# Patient Record
Sex: Female | Born: 1960 | Race: White | Hispanic: No | Smoking: Never smoker
Health system: Southern US, Community
[De-identification: ages and names within clinical notes are randomized; demographics above are authoritative.]

## PROBLEM LIST (undated history)

## (undated) HISTORY — PX: BREAST ENHANCEMENT SURGERY: SHX7

## (undated) HISTORY — PX: MASTECTOMY: SHX3

---

## 2006-12-30 DIAGNOSIS — C50219 Malignant neoplasm of upper-inner quadrant of unspecified female breast: Secondary | ICD-10-CM | POA: Insufficient documentation

## 2007-06-04 HISTORY — PX: ABDOMINAL HYSTERECTOMY: SHX81

## 2013-04-15 HISTORY — PX: COLONOSCOPY: SHX174

## 2014-12-22 ENCOUNTER — Telehealth: Payer: Self-pay

## 2014-12-22 ENCOUNTER — Encounter: Payer: Self-pay | Admitting: Internal Medicine

## 2014-12-22 DIAGNOSIS — Z853 Personal history of malignant neoplasm of breast: Secondary | ICD-10-CM | POA: Insufficient documentation

## 2014-12-22 DIAGNOSIS — S29019A Strain of muscle and tendon of unspecified wall of thorax, initial encounter: Secondary | ICD-10-CM | POA: Insufficient documentation

## 2014-12-22 DIAGNOSIS — M722 Plantar fascial fibromatosis: Secondary | ICD-10-CM | POA: Insufficient documentation

## 2014-12-22 DIAGNOSIS — R6882 Decreased libido: Secondary | ICD-10-CM | POA: Insufficient documentation

## 2014-12-22 DIAGNOSIS — N952 Postmenopausal atrophic vaginitis: Secondary | ICD-10-CM | POA: Insufficient documentation

## 2014-12-22 DIAGNOSIS — G4700 Insomnia, unspecified: Secondary | ICD-10-CM | POA: Insufficient documentation

## 2014-12-22 MED ORDER — METHOCARBAMOL 500 MG PO TABS: 500.0000 mg | ORAL_TABLET | Freq: Three times a day (TID) | ORAL | Status: DC

## 2014-12-22 NOTE — Telephone Encounter (Signed)
Rx for Lorazepam was faxed to [facility] on 10/12/14 for #60 with 5 RF.  She should not need another Rx.

## 2014-12-22 NOTE — Telephone Encounter (Signed)
Patient needs muscle relaxant and Lorazepam to [facility].dr

## 2015-03-20 ENCOUNTER — Other Ambulatory Visit: Payer: Self-pay | Admitting: Internal Medicine

## 2015-03-20 MED ORDER — LORAZEPAM 1 MG PO TABS: 2.0000 mg | ORAL_TABLET | Freq: Every day | ORAL | Status: DC

## 2015-03-30 ENCOUNTER — Other Ambulatory Visit: Payer: Self-pay | Admitting: Internal Medicine

## 2015-08-19 ENCOUNTER — Other Ambulatory Visit: Payer: Self-pay | Admitting: Internal Medicine

## 2015-09-12 ENCOUNTER — Other Ambulatory Visit: Payer: Self-pay

## 2015-09-12 MED ORDER — LORAZEPAM 1 MG PO TABS: 2.0000 mg | ORAL_TABLET | Freq: Every day | ORAL | Status: DC

## 2015-09-12 NOTE — Telephone Encounter (Signed)
Patient is requesting refill on Lorazepam. She reports that she will run out tomorrow. She also mentions that she will call next week to schedule an appt to have her cholesterol checked.

## 2015-09-13 ENCOUNTER — Other Ambulatory Visit: Payer: Self-pay | Admitting: Internal Medicine

## 2015-09-25 ENCOUNTER — Other Ambulatory Visit: Payer: Self-pay

## 2015-09-25 ENCOUNTER — Encounter: Payer: Self-pay | Admitting: Internal Medicine

## 2015-09-25 ENCOUNTER — Ambulatory Visit (INDEPENDENT_AMBULATORY_CARE_PROVIDER_SITE_OTHER): Payer: BLUE CROSS/BLUE SHIELD | Admitting: Internal Medicine

## 2015-09-25 VITALS — BP 112/70 | HR 76 | Ht 63.0 in

## 2015-09-25 DIAGNOSIS — N952 Postmenopausal atrophic vaginitis: Secondary | ICD-10-CM

## 2015-09-25 DIAGNOSIS — Z853 Personal history of malignant neoplasm of breast: Secondary | ICD-10-CM

## 2015-09-25 DIAGNOSIS — M6248 Contracture of muscle, other site: Secondary | ICD-10-CM

## 2015-09-25 DIAGNOSIS — E785 Hyperlipidemia, unspecified: Secondary | ICD-10-CM | POA: Insufficient documentation

## 2015-09-25 DIAGNOSIS — G4700 Insomnia, unspecified: Secondary | ICD-10-CM | POA: Diagnosis not present

## 2015-09-25 DIAGNOSIS — M62838 Other muscle spasm: Secondary | ICD-10-CM | POA: Insufficient documentation

## 2015-09-25 MED ORDER — METHOCARBAMOL 750 MG PO TABS: 750.0000 mg | ORAL_TABLET | Freq: Three times a day (TID) | ORAL | Status: DC

## 2015-09-25 NOTE — Progress Notes (Signed)
Date:  09/25/2015   Name:  Joanne Lopez   DOB:  [birth year]   MRN:  [id number]   Chief Complaint: Follow-up; Thyroid Problem; and Hyperlipidemia Thyroid Problem Presents for initial (she in convinced that she has thyroid disease) visit. Symptoms include dry skin, hair loss and weight gain. Patient reports no fatigue or palpitations. Past treatments include nothing. Her past medical history is significant for hyperlipidemia.  Hyperlipidemia This is a chronic problem. Recent lipid tests were reviewed and are high. Factors aggravating her hyperlipidemia include estrogen. Pertinent negatives include no chest pain or shortness of breath. She is currently on no antihyperlipidemic treatment.  Insomnia Primary symptoms: sleep disturbance, difficulty falling asleep, frequent awakening.  The problem occurs every several days. Past treatments include medication. The treatment provided significant relief. Prior diagnostic workup includes:  No prior workup.  Cervical neck pain - seen by Ortho.  Taking robaxin and Celebrex.  Started with a strain injury.  MRI was normal.  Still very uncomfortable.  Only taking robaxin once a day.  Tried PTx but was in pain for a week after. Breast Cancer - due for annual mammogram and CXR.  Will see Oncology this summer.  No new issues. Atrophic vaginitis - using Osphena with variable benefit.  Still doing topical progesterone/testosterone compouned.  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  Review of Systems  Constitutional: Positive for weight gain, appetite change (craving carbs) and unexpected weight change (20 lbs this year). Negative for fever, chills and fatigue.  Respiratory: Negative for choking, shortness of breath and wheezing.   Cardiovascular: Negative for chest pain, palpitations and leg swelling.  Genitourinary: Positive for vaginal pain. Negative for vaginal bleeding and vaginal discharge.    Musculoskeletal: Negative for arthralgias and gait problem.  Neurological: Negative for dizziness, seizures, syncope and headaches.  Psychiatric/Behavioral: Positive for sleep disturbance. The patient has insomnia.     Patient Active Problem List   Diagnosis Date Noted  . Hyperlipidemia, mild 09/25/2015  . Muscle spasms of head or neck 09/25/2015  . Atrophic vaginitis 12/22/2014  . Decreased libido 12/22/2014  . Insomnia, persistent 12/22/2014  . Hx of breast cancer 12/22/2014  . Plantar fasciitis 12/22/2014    Prior to Admission medications   Medication Sig Start Date End Date Taking? Authorizing Provider  celecoxib (CELEBREX) 100 MG capsule Take 100 mg by mouth 2 (two) times daily. 07/20/15   Historical Provider, MD  conjugated estrogens (PREMARIN) vaginal cream  09/07/09   Historical Provider, MD  diazepam (VALIUM) 5 MG tablet  11/03/09   Historical Provider, MD  Eszopiclone 3 MG TABS  08/15/09   Historical Provider, MD  fluticasone (FLONASE) 50 MCG/ACT nasal spray Place 2 sprays into the nose daily at 2 PM daily at 2 PM. 08/05/14   Historical Provider, MD  gabapentin (NEURONTIN) 100 MG capsule  08/21/09   Historical Provider, MD  ibuprofen (ADVIL,MOTRIN) 600 MG tablet TAKE ONE TABLET BY MOUTH TWICE DAILY AS NEEDED. 08/20/15   Glean Hess, MD  LORazepam (ATIVAN) 1 MG tablet Take 2 tablets (2 mg total) by mouth at bedtime. 09/12/15   Glean Hess, MD  methocarbamol (ROBAXIN) 750 MG tablet TAKE 1 TABLET BY MOUTH EVERY 4 HOURS. 07/06/15   Historical Provider, MD  Multiple Vitamins tablet Take 1 tablet by mouth daily at 2 PM daily at 2 PM.    Historical Provider, MD  OSPHENA 60 MG TABS TAKE ONE TABLET BY MOUTH EVERY  DAY. 09/13/15   Glean Hess, MD  RESTASIS 0.05 % ophthalmic emulsion INSTILL 1 DROP INTO AFFECTED EYE(S) BY OPHTHALMIC ROUTE EVERY 12 HOURS 08/18/15   Historical Provider, MD  urea (CARMOL) 40 % CREA  11/03/09   Historical Provider, MD    Allergies  Allergen Reactions  .  Codeine Hives  . Levofloxacin Hives  . Prednisone   . Penicillins Rash    Past Surgical History  Procedure Laterality Date  . Abdominal hysterectomy  2009    total hysterectomy  . Mastectomy  2008  . Breast enhancement surgery  2008, 2009    Social History  Substance Use Topics  . Smoking status: Never Smoker   . Smokeless tobacco: None  . Alcohol Use: No     Medication list has been reviewed and updated.   Physical Exam  Constitutional: She is oriented to person, place, and time. She appears well-developed. No distress.  HENT:  Head: Normocephalic and atraumatic.  Cardiovascular: Normal rate, regular rhythm and normal heart sounds.   Pulmonary/Chest: Effort normal and breath sounds normal. No respiratory distress.  Musculoskeletal: She exhibits no edema.       Cervical back: She exhibits decreased range of motion, tenderness and spasm.  Lymphadenopathy:    She has no cervical adenopathy.  Neurological: She is alert and oriented to person, place, and time.  Reflex Scores:      Bicep reflexes are 2+ on the right side and 2+ on the left side. Skin: Skin is warm and dry. No rash noted.  Psychiatric: She has a normal mood and affect. Her behavior is normal. Thought content normal.  Nursing note and vitals reviewed.   BP 112/70 mmHg  Pulse 76  Ht 5\' 3"  (1.6 m)  Wt   Assessment and Plan: 1. Insomnia, persistent Using Ativan nightly  - Thyroid Panel With TSH  2. Atrophic vaginitis Continue Osphena  3. Hyperlipidemia, mild monitoring - Lipid panel  4. Hx of breast cancer Follow up with Oncology (Dr. Ella Jubilee) - CBC with Differential/Platelet - Comprehensive metabolic panel  5. Muscle spasms of head or neck Continue celebrex and heat Increase muscle relaxant to tid Try medical massage - methocarbamol (ROBAXIN) 750 MG tablet; Take 1 tablet (750 mg total) by mouth 3 (three) times daily.  Dispense: 90 tablet; Refill: [place], MD [facility]  09/25/2015

## 2015-09-25 NOTE — Patient Instructions (Signed)
Schedule mammogram and CXR.

## 2015-09-26 NOTE — Telephone Encounter (Signed)
pts coming in on 8/11 for cpe.

## 2015-10-02 DIAGNOSIS — Z853 Personal history of malignant neoplasm of breast: Secondary | ICD-10-CM | POA: Diagnosis not present

## 2015-10-02 DIAGNOSIS — E785 Hyperlipidemia, unspecified: Secondary | ICD-10-CM | POA: Diagnosis not present

## 2015-10-02 DIAGNOSIS — G4700 Insomnia, unspecified: Secondary | ICD-10-CM | POA: Diagnosis not present

## 2015-10-03 LAB — LIPID PANEL
CHOLESTEROL TOTAL: 207 mg/dL — AB (ref 100–199)
Chol/HDL Ratio: 2.2 ratio units (ref 0.0–4.4)
HDL: 94 mg/dL (ref >39)
LDL Calculated: 103 mg/dL — ABNORMAL HIGH (ref 0–99)
TRIGLYCERIDES: 51 mg/dL (ref 0–149)
VLDL Cholesterol Cal: 10 mg/dL (ref 5–40)

## 2015-10-03 LAB — COMPREHENSIVE METABOLIC PANEL
A/G RATIO: 1.8 (ref 1.2–2.2)
ALK PHOS: 63 IU/L (ref 39–117)
ALT: 15 IU/L (ref 0–32)
AST: 18 IU/L (ref 0–40)
Albumin: 4.3 g/dL (ref 3.5–5.5)
BILIRUBIN TOTAL: 0.7 mg/dL (ref 0.0–1.2)
BUN/Creatinine Ratio: 19 (ref 9–23)
BUN: 15 mg/dL (ref 6–24)
CALCIUM: 9.5 mg/dL (ref 8.7–10.2)
CHLORIDE: 101 mmol/L (ref 96–106)
CO2: 23 mmol/L (ref 18–29)
Creatinine, Ser: 0.77 mg/dL (ref 0.57–1.00)
GFR calc Af Amer: 100 mL/min/{1.73_m2} (ref >59)
GFR calc non Af Amer: 87 mL/min/{1.73_m2} (ref >59)
GLOBULIN, TOTAL: 2.4 g/dL (ref 1.5–4.5)
GLUCOSE: 101 mg/dL — AB (ref 65–99)
POTASSIUM: 4.4 mmol/L (ref 3.5–5.2)
SODIUM: 142 mmol/L (ref 134–144)
Total Protein: 6.7 g/dL (ref 6.0–8.5)

## 2015-10-03 LAB — CBC WITH DIFFERENTIAL/PLATELET
BASOS ABS: 0.1 10*3/uL (ref 0.0–0.2)
Basos: 2 %
EOS (ABSOLUTE): 0.1 10*3/uL (ref 0.0–0.4)
EOS: 1 %
HEMATOCRIT: 49.6 % — AB (ref 34.0–46.6)
Hemoglobin: 16.7 g/dL — ABNORMAL HIGH (ref 11.1–15.9)
IMMATURE GRANULOCYTES: 0 %
Immature Grans (Abs): 0 10*3/uL (ref 0.0–0.1)
LYMPHS ABS: 2.4 10*3/uL (ref 0.7–3.1)
Lymphs: 42 %
MCH: 30.7 pg (ref 26.6–33.0)
MCHC: 33.7 g/dL (ref 31.5–35.7)
MCV: 91 fL (ref 79–97)
MONOS ABS: 0.5 10*3/uL (ref 0.1–0.9)
Monocytes: 9 %
NEUTROS PCT: 46 %
Neutrophils Absolute: 2.6 10*3/uL (ref 1.4–7.0)
PLATELETS: 251 10*3/uL (ref 150–379)
RBC: 5.44 x10E6/uL — AB (ref 3.77–5.28)
RDW: 12.6 % (ref 12.3–15.4)
WBC: 5.7 10*3/uL (ref 3.4–10.8)

## 2015-10-03 LAB — THYROID PANEL WITH TSH
FREE THYROXINE INDEX: 2.4 (ref 1.2–4.9)
T3 UPTAKE RATIO: 31 % (ref 24–39)
T4, Total: 7.7 ug/dL (ref 4.5–12.0)
TSH: 1.67 u[IU]/mL (ref 0.450–4.500)

## 2015-10-18 ENCOUNTER — Other Ambulatory Visit: Payer: Self-pay | Admitting: Internal Medicine

## 2015-10-18 ENCOUNTER — Ambulatory Visit: Payer: BLUE CROSS/BLUE SHIELD | Admitting: Internal Medicine

## 2015-10-18 MED ORDER — CELECOXIB 100 MG PO CAPS: 100.0000 mg | ORAL_CAPSULE | Freq: Two times a day (BID) | ORAL | Status: DC

## 2015-11-17 ENCOUNTER — Other Ambulatory Visit: Payer: Self-pay | Admitting: Internal Medicine

## 2015-11-17 MED ORDER — CELECOXIB 200 MG PO CAPS: 200.0000 mg | ORAL_CAPSULE | Freq: Two times a day (BID) | ORAL | Status: DC

## 2015-12-15 ENCOUNTER — Other Ambulatory Visit: Payer: Self-pay | Admitting: Internal Medicine

## 2016-01-12 ENCOUNTER — Encounter (INDEPENDENT_AMBULATORY_CARE_PROVIDER_SITE_OTHER): Payer: Self-pay

## 2016-01-12 ENCOUNTER — Encounter: Payer: Self-pay | Admitting: Internal Medicine

## 2016-01-12 ENCOUNTER — Ambulatory Visit (INDEPENDENT_AMBULATORY_CARE_PROVIDER_SITE_OTHER): Payer: BLUE CROSS/BLUE SHIELD | Admitting: Internal Medicine

## 2016-01-12 VITALS — BP 104/78 | HR 76 | Resp 16 | Ht 63.0 in

## 2016-01-12 DIAGNOSIS — R600 Localized edema: Secondary | ICD-10-CM | POA: Diagnosis not present

## 2016-01-12 DIAGNOSIS — L853 Xerosis cutis: Secondary | ICD-10-CM

## 2016-01-12 DIAGNOSIS — N952 Postmenopausal atrophic vaginitis: Secondary | ICD-10-CM | POA: Diagnosis not present

## 2016-01-12 DIAGNOSIS — Z853 Personal history of malignant neoplasm of breast: Secondary | ICD-10-CM | POA: Diagnosis not present

## 2016-01-12 DIAGNOSIS — Z0000 Encounter for general adult medical examination without abnormal findings: Secondary | ICD-10-CM

## 2016-01-12 DIAGNOSIS — G4700 Insomnia, unspecified: Secondary | ICD-10-CM

## 2016-01-12 LAB — POCT URINALYSIS DIPSTICK
Bilirubin, UA: NEGATIVE
Blood, UA: NEGATIVE
GLUCOSE UA: NEGATIVE
Ketones, UA: NEGATIVE
LEUKOCYTES UA: NEGATIVE
NITRITE UA: NEGATIVE
Protein, UA: NEGATIVE
SPEC GRAV UA: 1.01
pH, UA: 5

## 2016-01-12 MED ORDER — IBUPROFEN 600 MG PO TABS: 600.0000 mg | ORAL_TABLET | Freq: Two times a day (BID) | ORAL | 3 refills | Status: DC | PRN

## 2016-01-12 MED ORDER — ESTROGENS, CONJUGATED 0.625 MG/GM VA CREA: TOPICAL_CREAM | Freq: Every day | VAGINAL | 5 refills | Status: DC

## 2016-01-12 MED ORDER — UREA 40 % EX CREA: 1.0000 "application " | TOPICAL_CREAM | Freq: Every day | CUTANEOUS | 1 refills | Status: DC

## 2016-01-12 MED ORDER — HYDROCHLOROTHIAZIDE 12.5 MG PO CAPS: 12.5000 mg | ORAL_CAPSULE | Freq: Every day | ORAL | 1 refills | Status: DC

## 2016-01-12 MED ORDER — LORAZEPAM 1 MG PO TABS: 2.0000 mg | ORAL_TABLET | Freq: Every day | ORAL | 5 refills | Status: DC

## 2016-01-12 NOTE — Progress Notes (Signed)
Date:  01/12/2016   Name:  Joanne Lopez   DOB:  02-Jun-1961   MRN:  [id number]   Chief Complaint: Annual Exam (Mother died and she has not had time  for Mmmogram. ) Joanne Lopez is a 55 y.o. female who presents today for her Complete Annual Exam. She feels fairly well. She reports exercising intermittently. She reports she is sleeping fairly well with medication. She has not had mammogram.   Review of Systems  Constitutional: Negative for chills, fatigue, fever and unexpected weight change (unable to lose weight - wants to see thyroid MD).  HENT: Negative for congestion, hearing loss, tinnitus, trouble swallowing and voice change.   Eyes: Negative for visual disturbance.  Respiratory: Negative for cough, chest tightness, shortness of breath and wheezing.   Cardiovascular: Positive for leg swelling (ankle edema). Negative for chest pain and palpitations.  Gastrointestinal: Negative for abdominal pain, constipation, diarrhea and vomiting.  Endocrine: Negative for polydipsia and polyuria.  Genitourinary: Positive for dyspareunia. Negative for dysuria, frequency, genital sores, vaginal bleeding and vaginal discharge.  Musculoskeletal: Positive for neck pain and neck stiffness. Negative for arthralgias, gait problem and joint swelling.  Skin: Positive for rash. Negative for color change.       Dry skin, peeling nails, scaly face and hands  Neurological: Negative for dizziness, tremors, light-headedness and headaches.  Hematological: Negative for adenopathy. Does not bruise/bleed easily.  Psychiatric/Behavioral: Positive for sleep disturbance. Negative for dysphoric mood. The patient is not nervous/anxious.     Patient Active Problem List   Diagnosis Date Noted  . Xerosis of skin 01/12/2016  . Hyperlipidemia, mild 09/25/2015  . Muscle spasms of head or neck 09/25/2015  . Atrophic vaginitis 12/22/2014  . Decreased libido 12/22/2014  . Insomnia, persistent 12/22/2014  . Hx of breast  cancer 12/22/2014  . Plantar fasciitis 12/22/2014    Prior to Admission medications   Medication Sig Start Date End Date Taking? Authorizing Provider  Biotin 5000 MCG CAPS Take 10,000 mcg by mouth daily.   Yes Historical Provider, MD  celecoxib (CELEBREX) 200 MG capsule Take 1 capsule (200 mg total) by mouth 2 (two) times daily. 11/17/15  Yes Glean Hess, MD  ibuprofen (ADVIL,MOTRIN) 600 MG tablet TAKE ONE TABLET BY MOUTH TWICE DAILY AS NEEDED. 08/20/15  Yes Glean Hess, MD  LORazepam (ATIVAN) 1 MG tablet Take 2 tablets (2 mg total) by mouth at bedtime. 12/15/15  Yes Glean Hess, MD  methocarbamol (ROBAXIN) 750 MG tablet Take 1 tablet (750 mg total) by mouth 3 (three) times daily. 09/25/15  Yes Glean Hess, MD  Multiple Vitamins tablet Take 1 tablet by mouth daily at 2 PM daily at 2 PM.   Yes Historical Provider, MD  OSPHENA 60 MG TABS TAKE ONE TABLET BY MOUTH EVERY DAY. 09/13/15  Yes Glean Hess, MD  RESTASIS 0.05 % ophthalmic emulsion INSTILL 1 DROP INTO AFFECTED EYE(S) BY OPHTHALMIC ROUTE EVERY 12 HOURS 08/18/15  Yes Historical Provider, MD  urea (CARMOL) 40 % CREA  11/03/09  Yes Historical Provider, MD    Allergies  Allergen Reactions  . Codeine Hives  . Levofloxacin Hives  . Prednisone   . Penicillins Rash    Past Surgical History:  Procedure Laterality Date  . ABDOMINAL HYSTERECTOMY  2009   total hysterectomy  . BREAST ENHANCEMENT SURGERY  2008, 2009  . MASTECTOMY  2008    Social History  Substance Use Topics  . Smoking status: Never Smoker  . Smokeless tobacco:  Not on file  . Alcohol use No     Medication list has been reviewed and updated.   Physical Exam  Constitutional: She is oriented to person, place, and time. She appears well-developed and well-nourished. No distress.  HENT:  Head: Normocephalic and atraumatic.  Right Ear: Tympanic membrane and ear canal normal.  Left Ear: Tympanic membrane and ear canal normal.  Nose: Right sinus  exhibits no maxillary sinus tenderness. Left sinus exhibits no maxillary sinus tenderness.  Mouth/Throat: Uvula is midline and oropharynx is clear and moist.  Eyes: Conjunctivae and EOM are normal. Right eye exhibits no discharge. Left eye exhibits no discharge. No scleral icterus.  Neck: Normal range of motion. Carotid bruit is not present. No erythema present. No thyromegaly present.  Cardiovascular: Normal rate, regular rhythm, normal heart sounds and normal pulses.   Pulmonary/Chest: Effort normal. No respiratory distress. She has no wheezes. Right breast exhibits no mass. Left breast exhibits no mass.  Surgical changes to both breasts; reconstruction on right with native nipple.  Abdominal: Soft. Bowel sounds are normal. There is no hepatosplenomegaly. There is no tenderness. There is no CVA tenderness.  Musculoskeletal: Normal range of motion. She exhibits edema (trace ankle edema).  Lymphadenopathy:    She has no cervical adenopathy.    She has no axillary adenopathy.  Neurological: She is alert and oriented to person, place, and time. She has normal reflexes. No cranial nerve deficit or sensory deficit.  Skin: Skin is warm, dry and intact. No rash noted.  Psychiatric: She has a normal mood and affect. Her speech is normal and behavior is normal. Thought content normal.  Nursing note and vitals reviewed.   BP 104/78 (BP Location: Right Arm, Patient Position: Sitting, Cuff Size: Normal)   Pulse 76   Resp 16   Ht 5\' 3"  (1.6 m)   SpO2 100%   Assessment and Plan: 1. Annual physical exam Consult Endoscopy for possible thyroid  2. Localized edema Continue salt restriction - hydrochlorothiazide (MICROZIDE) 12.5 MG capsule; Take 1 capsule (12.5 mg total) by mouth daily.  Dispense: 30 capsule; Refill: 1  3. Hx of breast cancer Mammogram and CXR  4. Insomnia, persistent - LORazepam (ATIVAN) 1 MG tablet; Take 2 tablets (2 mg total) by mouth at bedtime.  Dispense: 60 tablet; Refill:  5  5. Atrophic vaginitis Consider stopping medication and starting estrogen  6. Xerosis of skin - urea (CARMOL) 40 % CREA; Apply 1 application topically daily.  Dispense: 85 each; Refill: [place], MD [facility]  01/12/2016

## 2016-01-27 DIAGNOSIS — E669 Obesity, unspecified: Secondary | ICD-10-CM | POA: Diagnosis not present

## 2016-02-02 DIAGNOSIS — K3580 Unspecified acute appendicitis: Secondary | ICD-10-CM | POA: Diagnosis not present

## 2016-02-02 DIAGNOSIS — Z853 Personal history of malignant neoplasm of breast: Secondary | ICD-10-CM | POA: Diagnosis not present

## 2016-02-02 DIAGNOSIS — Z9010 Acquired absence of unspecified breast and nipple: Secondary | ICD-10-CM | POA: Diagnosis not present

## 2016-02-02 DIAGNOSIS — R1031 Right lower quadrant pain: Secondary | ICD-10-CM | POA: Diagnosis not present

## 2016-02-02 DIAGNOSIS — Z880 Allergy status to penicillin: Secondary | ICD-10-CM | POA: Diagnosis not present

## 2016-02-02 DIAGNOSIS — R1030 Lower abdominal pain, unspecified: Secondary | ICD-10-CM | POA: Diagnosis not present

## 2016-02-02 DIAGNOSIS — R112 Nausea with vomiting, unspecified: Secondary | ICD-10-CM | POA: Diagnosis not present

## 2016-02-02 DIAGNOSIS — K353 Acute appendicitis with localized peritonitis: Secondary | ICD-10-CM | POA: Diagnosis not present

## 2016-02-02 DIAGNOSIS — K37 Unspecified appendicitis: Secondary | ICD-10-CM | POA: Diagnosis not present

## 2016-02-02 DIAGNOSIS — Z90710 Acquired absence of both cervix and uterus: Secondary | ICD-10-CM | POA: Diagnosis not present

## 2016-02-02 HISTORY — PX: LAPAROSCOPIC APPENDECTOMY: SUR753

## 2016-02-06 DIAGNOSIS — M545 Low back pain: Secondary | ICD-10-CM | POA: Diagnosis not present

## 2016-02-06 DIAGNOSIS — R1030 Lower abdominal pain, unspecified: Secondary | ICD-10-CM | POA: Diagnosis not present

## 2016-02-08 DIAGNOSIS — R109 Unspecified abdominal pain: Secondary | ICD-10-CM | POA: Diagnosis not present

## 2016-02-08 DIAGNOSIS — G8918 Other acute postprocedural pain: Secondary | ICD-10-CM | POA: Diagnosis not present

## 2016-02-08 DIAGNOSIS — R1033 Periumbilical pain: Secondary | ICD-10-CM | POA: Diagnosis not present

## 2016-02-08 DIAGNOSIS — Z880 Allergy status to penicillin: Secondary | ICD-10-CM | POA: Diagnosis not present

## 2016-02-08 DIAGNOSIS — Z853 Personal history of malignant neoplasm of breast: Secondary | ICD-10-CM | POA: Diagnosis not present

## 2016-02-08 DIAGNOSIS — R112 Nausea with vomiting, unspecified: Secondary | ICD-10-CM | POA: Diagnosis not present

## 2016-02-08 DIAGNOSIS — R1084 Generalized abdominal pain: Secondary | ICD-10-CM | POA: Diagnosis not present

## 2016-02-08 DIAGNOSIS — Z9889 Other specified postprocedural states: Secondary | ICD-10-CM | POA: Diagnosis not present

## 2016-02-08 DIAGNOSIS — F419 Anxiety disorder, unspecified: Secondary | ICD-10-CM | POA: Diagnosis not present

## 2016-02-16 DIAGNOSIS — Z9089 Acquired absence of other organs: Secondary | ICD-10-CM | POA: Diagnosis not present

## 2016-02-16 DIAGNOSIS — Z8719 Personal history of other diseases of the digestive system: Secondary | ICD-10-CM | POA: Diagnosis not present

## 2016-02-16 DIAGNOSIS — Z48815 Encounter for surgical aftercare following surgery on the digestive system: Secondary | ICD-10-CM | POA: Diagnosis not present

## 2016-04-08 DIAGNOSIS — H2513 Age-related nuclear cataract, bilateral: Secondary | ICD-10-CM | POA: Diagnosis not present

## 2016-04-08 DIAGNOSIS — H35371 Puckering of macula, right eye: Secondary | ICD-10-CM | POA: Diagnosis not present

## 2016-04-08 DIAGNOSIS — H04129 Dry eye syndrome of unspecified lacrimal gland: Secondary | ICD-10-CM | POA: Diagnosis not present

## 2016-04-08 DIAGNOSIS — H43819 Vitreous degeneration, unspecified eye: Secondary | ICD-10-CM | POA: Diagnosis not present

## 2016-04-15 ENCOUNTER — Other Ambulatory Visit: Payer: Self-pay | Admitting: Internal Medicine

## 2016-04-15 DIAGNOSIS — Z853 Personal history of malignant neoplasm of breast: Secondary | ICD-10-CM

## 2016-04-17 ENCOUNTER — Telehealth: Payer: Self-pay

## 2016-04-17 DIAGNOSIS — Z853 Personal history of malignant neoplasm of breast: Secondary | ICD-10-CM | POA: Diagnosis not present

## 2016-04-17 DIAGNOSIS — Z9012 Acquired absence of left breast and nipple: Secondary | ICD-10-CM | POA: Diagnosis not present

## 2016-04-17 DIAGNOSIS — Z1231 Encounter for screening mammogram for malignant neoplasm of breast: Secondary | ICD-10-CM | POA: Diagnosis not present

## 2016-04-17 DIAGNOSIS — Z9013 Acquired absence of bilateral breasts and nipples: Secondary | ICD-10-CM | POA: Diagnosis not present

## 2016-04-17 NOTE — Telephone Encounter (Signed)
Needs MRI order for breasts Sent to [facility]. Says she is 4 years late had Mammo today.  Call patient when done. Not sure about contrast.

## 2016-04-18 NOTE — Telephone Encounter (Signed)
LMTCB

## 2016-05-05 NOTE — Telephone Encounter (Signed)
Patient has not returned call.  Need more information about why she needs an MRI of breasts.

## 2016-05-15 DIAGNOSIS — D225 Melanocytic nevi of trunk: Secondary | ICD-10-CM | POA: Diagnosis not present

## 2016-05-15 DIAGNOSIS — L570 Actinic keratosis: Secondary | ICD-10-CM | POA: Diagnosis not present

## 2016-05-15 DIAGNOSIS — L814 Other melanin hyperpigmentation: Secondary | ICD-10-CM | POA: Diagnosis not present

## 2016-05-15 DIAGNOSIS — D2261 Melanocytic nevi of right upper limb, including shoulder: Secondary | ICD-10-CM | POA: Diagnosis not present

## 2016-05-15 DIAGNOSIS — D2262 Melanocytic nevi of left upper limb, including shoulder: Secondary | ICD-10-CM | POA: Diagnosis not present

## 2016-05-29 ENCOUNTER — Telehealth: Payer: Self-pay

## 2016-05-29 NOTE — Telephone Encounter (Signed)
She claims [facility] told her they can not do mammogram it has been to long and need MRI instead. I told her we must receive that recommendation from person other than herself. I will let [facility] handle this as you said.

## 2016-05-29 NOTE — Telephone Encounter (Signed)
[facility] states been to long for regular mammo needs MRI to screen for any breast cab=ncers due to implants. Should we make appt here or can we order MRI?

## 2016-05-29 NOTE — Telephone Encounter (Signed)
As I said previously, I do not know about getting a breast MRI in this setting.  I have always ordered Dx mammograms.   We need to start with the diagnostic mammogram and get MRI if needed afterwards.  I will send order to [facility] for them to call her.

## 2016-05-31 DIAGNOSIS — Z6826 Body mass index (BMI) 26.0-26.9, adult: Secondary | ICD-10-CM | POA: Diagnosis not present

## 2016-05-31 DIAGNOSIS — C50911 Malignant neoplasm of unspecified site of right female breast: Secondary | ICD-10-CM | POA: Diagnosis not present

## 2016-05-31 DIAGNOSIS — C50212 Malignant neoplasm of upper-inner quadrant of left female breast: Secondary | ICD-10-CM | POA: Diagnosis not present

## 2016-05-31 DIAGNOSIS — Z170 Estrogen receptor positive status [ER+]: Secondary | ICD-10-CM | POA: Diagnosis not present

## 2016-05-31 DIAGNOSIS — C50919 Malignant neoplasm of unspecified site of unspecified female breast: Secondary | ICD-10-CM | POA: Diagnosis not present

## 2016-07-02 ENCOUNTER — Ambulatory Visit: Payer: BLUE CROSS/BLUE SHIELD | Admitting: Internal Medicine

## 2016-07-03 ENCOUNTER — Ambulatory Visit (INDEPENDENT_AMBULATORY_CARE_PROVIDER_SITE_OTHER): Payer: BLUE CROSS/BLUE SHIELD | Admitting: Internal Medicine

## 2016-07-03 ENCOUNTER — Encounter: Payer: Self-pay | Admitting: Internal Medicine

## 2016-07-03 VITALS — BP 112/78 | HR 74 | Temp 97.6°F | Ht 63.0 in

## 2016-07-03 DIAGNOSIS — G4700 Insomnia, unspecified: Secondary | ICD-10-CM

## 2016-07-03 DIAGNOSIS — J0100 Acute maxillary sinusitis, unspecified: Secondary | ICD-10-CM

## 2016-07-03 DIAGNOSIS — N952 Postmenopausal atrophic vaginitis: Secondary | ICD-10-CM | POA: Diagnosis not present

## 2016-07-03 MED ORDER — LORAZEPAM 1 MG PO TABS
2.0000 mg | ORAL_TABLET | Freq: Every day | ORAL | 5 refills | Status: DC
Start: 2016-07-03 — End: 2016-08-29

## 2016-07-03 MED ORDER — SULFAMETHOXAZOLE-TRIMETHOPRIM 800-160 MG PO TABS: 1.0000 | ORAL_TABLET | Freq: Two times a day (BID) | ORAL | 0 refills | Status: DC

## 2016-07-03 MED ORDER — HYDROCOD POLST-CPM POLST ER 10-8 MG/5ML PO SUER: 5.0000 mL | Freq: Two times a day (BID) | ORAL | 0 refills | Status: DC

## 2016-07-03 NOTE — Progress Notes (Signed)
Date:  07/03/2016   Name:  Joanne Lopez   DOB:  1961/02/23   MRN:  [id number]   Chief Complaint: Ear Pain and Cough Cough  Associated symptoms include ear pain. Pertinent negatives include no chest pain or headaches.  Otalgia   There is pain in both ears. This is a new problem. The current episode started in the past 7 days. The problem occurs constantly. The problem has been unchanged. There has been no fever. Associated symptoms include coughing. Pertinent negatives include no headaches.  Insomnia  Primary symptoms: sleep disturbance, difficulty falling asleep, frequent awakening.  The problem occurs nightly. Past treatments include medication. The treatment provided significant relief.  Menopause syndrome - with dyspareunia.  Improved some with premarin cream, Osphena and trying a THC derivative to help with sleep and anxiety.    Review of Systems  HENT: Positive for ear pain and sinus pressure. Negative for congestion and sinus pain.   Eyes: Negative for visual disturbance.  Respiratory: Positive for cough.   Cardiovascular: Negative for chest pain and palpitations.  Genitourinary: Positive for dyspareunia. Negative for dysuria.  Neurological: Negative for dizziness, numbness and headaches.  Psychiatric/Behavioral: Positive for sleep disturbance. The patient is nervous/anxious and has insomnia.     Patient Active Problem List   Diagnosis Date Noted  . Xerosis of skin 01/12/2016  . Hyperlipidemia, mild 09/25/2015  . Muscle spasms of head or neck 09/25/2015  . Atrophic vaginitis 12/22/2014  . Decreased libido 12/22/2014  . Insomnia, persistent 12/22/2014  . Hx of breast cancer 12/22/2014  . Plantar fasciitis 12/22/2014    Prior to Admission medications   Medication Sig Start Date End Date Taking? Authorizing Provider  Biotin 5000 MCG CAPS Take 10,000 mcg by mouth daily.   Yes Historical Provider, MD  celecoxib (CELEBREX) 200 MG capsule Take 1 capsule (200 mg total) by  mouth 2 (two) times daily. 11/17/15  Yes Glean Hess, MD  conjugated estrogens (PREMARIN) vaginal cream Place vaginally at bedtime. Use three times per week 01/12/16  Yes Glean Hess, MD  ibuprofen (ADVIL,MOTRIN) 600 MG tablet Take 1 tablet (600 mg total) by mouth 2 (two) times daily as needed. 01/12/16  Yes Glean Hess, MD  LORazepam (ATIVAN) 1 MG tablet Take 2 tablets (2 mg total) by mouth at bedtime. 01/12/16  Yes Glean Hess, MD  Multiple Vitamins tablet Take 1 tablet by mouth daily at 2 PM daily at 2 PM.   Yes Historical Provider, MD  OSPHENA 60 MG TABS TAKE ONE TABLET BY MOUTH EVERY DAY. 09/13/15  Yes Glean Hess, MD  RESTASIS 0.05 % ophthalmic emulsion INSTILL 1 DROP INTO AFFECTED EYE(S) BY OPHTHALMIC ROUTE EVERY 12 HOURS 08/18/15   Historical Provider, MD    Allergies  Allergen Reactions  . Codeine Hives  . Levofloxacin Hives  . Prednisone   . Penicillins Rash    Past Surgical History:  Procedure Laterality Date  . ABDOMINAL HYSTERECTOMY  2009   total hysterectomy  . BREAST ENHANCEMENT SURGERY  2008, 2009  . MASTECTOMY  2008    Social History  Substance Use Topics  . Smoking status: Never Smoker  . Smokeless tobacco: Never Used  . Alcohol use No     Medication list has been reviewed and updated.   Physical Exam  Constitutional: She is oriented to person, place, and time. She appears well-developed and well-nourished.  HENT:  Right Ear: External ear and ear canal normal. Tympanic membrane is retracted. Tympanic membrane  is not erythematous.  Left Ear: External ear and ear canal normal. Tympanic membrane is retracted. Tympanic membrane is not erythematous.  Nose: Right sinus exhibits maxillary sinus tenderness. Right sinus exhibits no frontal sinus tenderness. Left sinus exhibits no maxillary sinus tenderness and no frontal sinus tenderness.  Mouth/Throat: Uvula is midline and mucous membranes are normal. No oral lesions. Posterior oropharyngeal  erythema present. No oropharyngeal exudate.  Cardiovascular: Normal rate, regular rhythm and normal heart sounds.   Pulmonary/Chest: Breath sounds normal. She has no wheezes. She has no rales.  Lymphadenopathy:    She has no cervical adenopathy.  Neurological: She is alert and oriented to person, place, and time.    BP 112/78   Pulse 74   Temp 97.6 F (36.4 C)   Ht 5\' 3"  (1.6 m)   SpO2 98%   Assessment and Plan: 1. Acute non-recurrent maxillary sinusitis - sulfamethoxazole-trimethoprim (BACTRIM DS,SEPTRA DS) 800-160 MG tablet; Take 1 tablet by mouth 2 (two) times daily.  Dispense: 20 tablet; Refill: 0 - chlorpheniramine-HYDROcodone (TUSSIONEX PENNKINETIC ER) 10-8 MG/5ML SUER; Take 5 mLs by mouth 2 (two) times daily.  Dispense: 473 mL; Refill: 0  2. Insomnia, persistent - LORazepam (ATIVAN) 1 MG tablet; Take 2 tablets (2 mg total) by mouth at bedtime.  Dispense: 60 tablet; Refill: 5  3. Atrophic vaginitis Continue current therapy   Halina Maidens, MD [facility]  07/03/2016

## 2016-08-08 ENCOUNTER — Other Ambulatory Visit: Payer: Self-pay | Admitting: Internal Medicine

## 2016-08-13 DIAGNOSIS — H35371 Puckering of macula, right eye: Secondary | ICD-10-CM | POA: Diagnosis not present

## 2016-08-13 DIAGNOSIS — H04129 Dry eye syndrome of unspecified lacrimal gland: Secondary | ICD-10-CM | POA: Diagnosis not present

## 2016-08-28 ENCOUNTER — Ambulatory Visit: Payer: Self-pay | Admitting: Internal Medicine

## 2016-08-29 ENCOUNTER — Ambulatory Visit (INDEPENDENT_AMBULATORY_CARE_PROVIDER_SITE_OTHER): Payer: BLUE CROSS/BLUE SHIELD | Admitting: Internal Medicine

## 2016-08-29 ENCOUNTER — Encounter: Payer: Self-pay | Admitting: Internal Medicine

## 2016-08-29 VITALS — BP 112/82 | HR 84 | Ht 62.5 in

## 2016-08-29 DIAGNOSIS — F411 Generalized anxiety disorder: Secondary | ICD-10-CM

## 2016-08-29 DIAGNOSIS — G4700 Insomnia, unspecified: Secondary | ICD-10-CM | POA: Diagnosis not present

## 2016-08-29 DIAGNOSIS — F419 Anxiety disorder, unspecified: Secondary | ICD-10-CM | POA: Insufficient documentation

## 2016-08-29 MED ORDER — LORAZEPAM 1 MG PO TABS: 2.0000 mg | ORAL_TABLET | Freq: Three times a day (TID) | ORAL | 2 refills | Status: DC | PRN

## 2016-08-29 MED ORDER — SUVOREXANT 15 MG PO TABS: 15.0000 mg | ORAL_TABLET | Freq: Every day | ORAL | 0 refills | Status: DC

## 2016-08-29 MED ORDER — SUVOREXANT 10 MG PO TABS: 10.0000 mg | ORAL_TABLET | Freq: Every day | ORAL | Status: DC

## 2016-08-29 NOTE — Patient Instructions (Signed)
Take Lorazepam 1 mg 2 hours before bedtime  Take Belsomra at bedtime - start with 10 mg then increase to 15 mg if needed

## 2016-08-29 NOTE — Progress Notes (Signed)
Date:  08/29/2016   Name:  Joanne Lopez   DOB:  [birth year]   MRN:  [id number]   Chief Complaint: Insomnia (Wants to discuss sleep medicine. Was upping dose and taking but told per Dr. Army Melia to be seena nd should not take higher dose because thats more than max.) Insomnia  Primary symptoms: sleep disturbance, difficulty falling asleep, frequent awakening.  The current episode started more than one month. The problem occurs nightly. The problem has been gradually worsening since onset.  She had been taking Lorazepam 2 mg at night.  Over the past few months the sleep has been worse.  It may be aggravated by recent death of her mother and issues with siblings. She can not take Ambien or Lunesta due to hx of breast cancer (instructed on this by Oncologist).   Review of Systems  Constitutional: Positive for fatigue. Negative for chills and fever.  Respiratory: Negative for chest tightness, shortness of breath and wheezing.   Cardiovascular: Negative for chest pain and palpitations.  Psychiatric/Behavioral: Positive for sleep disturbance. Negative for dysphoric mood. The patient is nervous/anxious and has insomnia.     Patient Active Problem List   Diagnosis Date Noted  . Anxiety disorder 08/29/2016  . Xerosis of skin 01/12/2016  . Hyperlipidemia, mild 09/25/2015  . Muscle spasms of head or neck 09/25/2015  . Atrophic vaginitis 12/22/2014  . Decreased libido 12/22/2014  . Insomnia, persistent 12/22/2014  . Hx of breast cancer 12/22/2014  . Plantar fasciitis 12/22/2014    Prior to Admission medications   Medication Sig Start Date End Date Taking? Authorizing Provider  Biotin 5000 MCG CAPS Take 10,000 mcg by mouth daily.   Yes Historical Provider, MD  celecoxib (CELEBREX) 200 MG capsule Take 1 capsule (200 mg total) by mouth 2 (two) times daily. 08/08/16  Yes Glean Hess, MD  conjugated estrogens (PREMARIN) vaginal cream Place vaginally at bedtime. Use three times per week  01/12/16  Yes Glean Hess, MD  ibuprofen (ADVIL,MOTRIN) 600 MG tablet Take 1 tablet (600 mg total) by mouth 2 (two) times daily as needed. 01/12/16  Yes Glean Hess, MD  LORazepam (ATIVAN) 1 MG tablet Take 2 tablets (2 mg total) by mouth at bedtime. 07/03/16  Yes Glean Hess, MD  Multiple Vitamins tablet Take 1 tablet by mouth daily at 2 PM daily at 2 PM.   Yes Historical Provider, MD  OSPHENA 60 MG TABS TAKE ONE TABLET BY MOUTH EVERY DAY. 09/13/15  Yes Glean Hess, MD  RESTASIS 0.05 % ophthalmic emulsion INSTILL 1 DROP INTO AFFECTED EYE(S) BY OPHTHALMIC ROUTE EVERY 12 HOURS 08/18/15  Yes Historical Provider, MD    Allergies  Allergen Reactions  . Codeine Hives  . Levofloxacin Hives  . Prednisone   . Penicillins Rash    Past Surgical History:  Procedure Laterality Date  . ABDOMINAL HYSTERECTOMY  2009   total hysterectomy  . BREAST ENHANCEMENT SURGERY  2008, 2009  . LAPAROSCOPIC APPENDECTOMY  02/02/2016  . MASTECTOMY  2008    Social History  Substance Use Topics  . Smoking status: Never Smoker  . Smokeless tobacco: Never Used  . Alcohol use No     Medication list has been reviewed and updated.   Physical Exam  Constitutional: She is oriented to person, place, and time. She appears well-developed. No distress.  HENT:  Head: Normocephalic and atraumatic.  Cardiovascular: Normal rate, regular rhythm and normal heart sounds.   Pulmonary/Chest: Effort normal and breath sounds  normal. No respiratory distress. She has no wheezes.  Musculoskeletal: Normal range of motion.  Neurological: She is alert and oriented to person, place, and time.  Skin: Skin is warm and dry. No rash noted.  Psychiatric: She has a normal mood and affect. Her behavior is normal. Thought content normal.  Nursing note and vitals reviewed.   BP 112/82 (BP Location: Right Arm, Patient Position: Sitting, Cuff Size: Normal)   Pulse 84   Ht 5' 2.5" (1.588 m)   Assessment and Plan: 1.  Insomnia, persistent Will do trial of Belsomra 10 and 15 mg Continue lorazepam 1 mg in the evening  - LORazepam (ATIVAN) 1 MG tablet; Take 2 tablets (2 mg total) by mouth every 8 (eight) hours as needed for anxiety.  Dispense: 90 tablet; Refill: 2  2. Generalized anxiety disorder Lorazepam 0.5 -1 mg prn   Meds ordered this encounter  Medications  . Suvorexant (BELSOMRA) 10 MG TABS    Sig: Take 10 mg by mouth at bedtime.    Dispense:  10 tablet    Refill:  o  . Suvorexant (BELSOMRA) 15 MG TABS    Sig: Take 15 mg by mouth at bedtime.    Dispense:  10 tablet    Refill:  0  . LORazepam (ATIVAN) 1 MG tablet    Sig: Take 2 tablets (2 mg total) by mouth every 8 (eight) hours as needed for anxiety.    Dispense:  90 tablet    Refill:  2    Halina Maidens, MD [facility]  08/29/2016

## 2016-09-24 DIAGNOSIS — L578 Other skin changes due to chronic exposure to nonionizing radiation: Secondary | ICD-10-CM | POA: Diagnosis not present

## 2016-09-24 DIAGNOSIS — L609 Nail disorder, unspecified: Secondary | ICD-10-CM | POA: Diagnosis not present

## 2016-09-24 DIAGNOSIS — L718 Other rosacea: Secondary | ICD-10-CM | POA: Diagnosis not present

## 2016-09-25 ENCOUNTER — Other Ambulatory Visit: Payer: Self-pay | Admitting: Internal Medicine

## 2016-11-20 ENCOUNTER — Other Ambulatory Visit: Payer: Self-pay | Admitting: Internal Medicine

## 2016-11-20 ENCOUNTER — Encounter: Payer: Self-pay | Admitting: Internal Medicine

## 2017-01-09 ENCOUNTER — Telehealth: Payer: Self-pay

## 2017-01-09 ENCOUNTER — Other Ambulatory Visit: Payer: Self-pay | Admitting: Internal Medicine

## 2017-01-09 MED ORDER — IBUPROFEN 600 MG PO TABS: 600.0000 mg | ORAL_TABLET | Freq: Two times a day (BID) | ORAL | 3 refills | Status: DC | PRN

## 2017-01-09 NOTE — Telephone Encounter (Signed)
Fax received stating pt requesting refills on Ibuprofen 600 mg #180 for 90 day supply - [facility]. Please Advise.

## 2017-01-09 NOTE — Telephone Encounter (Signed)
Done

## 2017-01-10 ENCOUNTER — Telehealth: Payer: Self-pay | Admitting: Internal Medicine

## 2017-01-10 NOTE — Telephone Encounter (Signed)
Ok thank you 

## 2017-01-10 NOTE — Telephone Encounter (Signed)
Then just schedule her for a follow up visit - I will not code it as a physical but I must see her once a year to continue to fill medications. Make it a 30 minute.

## 2017-01-10 NOTE — Telephone Encounter (Signed)
I called patient to give reminder of physical appt on monday, She was under the impression the appt was cancelled. She had one done last year and was given a hard time by her insurance since she has no breast or uterus and only had blood work done it was not considered a physical. She cant come in and does not think she needs a physical because her insurance would not cover it and consider it as a office visit. Please Advise. DS

## 2017-01-13 ENCOUNTER — Encounter: Payer: Self-pay | Admitting: Internal Medicine

## 2017-01-13 ENCOUNTER — Encounter: Payer: BLUE CROSS/BLUE SHIELD | Admitting: Internal Medicine

## 2017-03-07 DIAGNOSIS — D485 Neoplasm of uncertain behavior of skin: Secondary | ICD-10-CM | POA: Diagnosis not present

## 2017-03-07 DIAGNOSIS — B351 Tinea unguium: Secondary | ICD-10-CM | POA: Diagnosis not present

## 2017-03-07 DIAGNOSIS — Z08 Encounter for follow-up examination after completed treatment for malignant neoplasm: Secondary | ICD-10-CM | POA: Diagnosis not present

## 2017-03-07 DIAGNOSIS — Z85828 Personal history of other malignant neoplasm of skin: Secondary | ICD-10-CM | POA: Diagnosis not present

## 2017-03-11 DIAGNOSIS — L820 Inflamed seborrheic keratosis: Secondary | ICD-10-CM | POA: Diagnosis not present

## 2017-03-13 ENCOUNTER — Ambulatory Visit (INDEPENDENT_AMBULATORY_CARE_PROVIDER_SITE_OTHER): Payer: BLUE CROSS/BLUE SHIELD | Admitting: Internal Medicine

## 2017-03-13 ENCOUNTER — Encounter: Payer: Self-pay | Admitting: Internal Medicine

## 2017-03-13 ENCOUNTER — Other Ambulatory Visit: Payer: Self-pay | Admitting: Internal Medicine

## 2017-03-13 VITALS — BP 120/72 | HR 68 | Ht 62.0 in | Wt 158.0 lb

## 2017-03-13 DIAGNOSIS — G4700 Insomnia, unspecified: Secondary | ICD-10-CM

## 2017-03-13 DIAGNOSIS — Z853 Personal history of malignant neoplasm of breast: Secondary | ICD-10-CM | POA: Diagnosis not present

## 2017-03-13 DIAGNOSIS — R635 Abnormal weight gain: Secondary | ICD-10-CM

## 2017-03-13 DIAGNOSIS — N952 Postmenopausal atrophic vaginitis: Secondary | ICD-10-CM

## 2017-03-13 MED ORDER — TRAZODONE HCL 50 MG PO TABS
25.0000 mg | ORAL_TABLET | Freq: Every day | ORAL | 0 refills | Status: DC
Start: 2017-03-13 — End: 2017-05-13

## 2017-03-13 MED ORDER — LORAZEPAM 1 MG PO TABS: 2.0000 mg | ORAL_TABLET | Freq: Three times a day (TID) | ORAL | 5 refills | Status: DC | PRN

## 2017-03-13 NOTE — Telephone Encounter (Signed)
.  mychart

## 2017-03-13 NOTE — Progress Notes (Signed)
Date:  03/13/2017   Name:  Joanne Lopez   DOB:  [birth year]   MRN:  [id number]   Chief Complaint: Insomnia (Med refill- discuss something different besides belsomra. )  Insomnia  Primary symptoms: sleep disturbance.  The problem occurs nightly. The symptoms are aggravated by anxiety and bed partner. Past treatments include medication. How long after going to bed to you fall asleep: over an hour.   Prior diagnostic workup includes:  No prior workup.  She has tried and failed Lunesta, Ambien and Temazepam.  She continues on lorazepam 2 mg nightly. She would like to reduce her use and taper off completely.  Weight gain - has gained about 15 lbs over the past year. She is eating healthily and walking 30 min 4-5 times per week. She has taken weight loss medication in the past few years given by bariatric M.D. It help curb her appetite and had no side effects. She will send me a message later with the name of the medication.   Breast cancer s/p reconstruction - she continues to see oncology once a year. Her last visit in December had normal tumor markers. No other labs were done. She had a physical last year but because there was no Pap smear or mammogram, her insurance refused to pay for it as a physical/well woman exam.  She is asking about having a biannual chest x-ray as well.  Review of Systems  Constitutional: Positive for fatigue. Negative for chills and fever.  HENT: Negative for trouble swallowing.   Eyes: Negative for visual disturbance.  Respiratory: Negative for chest tightness, shortness of breath and wheezing.   Cardiovascular: Negative for chest pain and palpitations.  Gastrointestinal: Negative for abdominal pain, blood in stool, constipation and nausea.  Genitourinary: Negative for dysuria.  Musculoskeletal: Positive for arthralgias (intermittent left arm ache). Negative for joint swelling and neck stiffness.  Skin: Negative for color change and rash.  Neurological:  Negative for dizziness and headaches.  Hematological: Negative for adenopathy.  Psychiatric/Behavioral: Positive for sleep disturbance. Negative for dysphoric mood and suicidal ideas. The patient is nervous/anxious and has insomnia.     Patient Active Problem List   Diagnosis Date Noted  . Anxiety disorder 08/29/2016  . Xerosis of skin 01/12/2016  . Hyperlipidemia, mild 09/25/2015  . Muscle spasms of head or neck 09/25/2015  . Atrophic vaginitis 12/22/2014  . Decreased libido 12/22/2014  . Insomnia, persistent 12/22/2014  . Hx of breast cancer 12/22/2014  . Plantar fasciitis 12/22/2014    Prior to Admission medications   Medication Sig Start Date End Date Taking? Authorizing Provider  Biotin 5000 MCG CAPS Take 10,000 mcg by mouth daily.   Yes [provider]  celecoxib (CELEBREX) 200 MG capsule Take 1 capsule by mouth 2 times daily. 09/25/16  Yes Glean Hess, MD  conjugated estrogens (PREMARIN) vaginal cream Place vaginally at bedtime. Use three times per week 01/12/16  Yes Glean Hess, MD  ibuprofen (ADVIL,MOTRIN) 600 MG tablet Take 1 tablet (600 mg total) by mouth 2 (two) times daily as needed. 01/09/17  Yes Glean Hess, MD  LORazepam (ATIVAN) 1 MG tablet Take 2 tablets (2 mg total) by mouth every 8 (eight) hours as needed for anxiety. 08/29/16  Yes Glean Hess, MD  Multiple Vitamins tablet Take 1 tablet by mouth daily at 2 PM daily at 2 PM.   Yes [provider]  OSPHENA 60 MG TABS TAKE ONE TABLET BY MOUTH EVERY DAY. 11/20/16  Yes  Glean Hess, MD  RESTASIS 0.05 % ophthalmic emulsion INSTILL 1 DROP INTO AFFECTED EYE(S) BY OPHTHALMIC ROUTE EVERY 12 HOURS 08/18/15  Yes [provider]  LORazepam (ATIVAN) 0.5 MG tablet Take 4 tablets (2 mg total) by mouth at bedtime. And 1 mg during the day if needed Patient not taking: Reported on 03/13/2017 11/20/16   Glean Hess, MD    Allergies  Allergen Reactions  . Codeine Hives  .  Levofloxacin Hives  . Prednisone   . Penicillins Rash    Past Surgical History:  Procedure Laterality Date  . ABDOMINAL HYSTERECTOMY  2009   total hysterectomy  . BREAST ENHANCEMENT SURGERY  2008, 2009  . LAPAROSCOPIC APPENDECTOMY  02/02/2016  . MASTECTOMY  2008    Social History  Substance Use Topics  . Smoking status: Never Smoker  . Smokeless tobacco: Never Used  . Alcohol use No     Medication list has been reviewed and updated.  PHQ 2/9 Scores 03/13/2017  PHQ - 2 Score 0    Physical Exam  Constitutional: She is oriented to person, place, and time. She appears well-developed. No distress.  HENT:  Head: Normocephalic and atraumatic.  Neck: Normal range of motion. Neck supple. No thyromegaly present.  Cardiovascular: Normal rate, regular rhythm and normal heart sounds.   Pulmonary/Chest: Effort normal and breath sounds normal. No respiratory distress. She has no wheezes.  Musculoskeletal: She exhibits no edema.  Neurological: She is alert and oriented to person, place, and time.  Skin: Skin is warm and dry. No rash noted.  Psychiatric: She has a normal mood and affect. Her behavior is normal. Thought content normal.  Nursing note and vitals reviewed.   BP 120/72 (BP Location: Right Arm, Patient Position: Sitting, Cuff Size: Normal)   Pulse 68   Ht 5\' 2"  (1.575 m)   SpO2 99%   Assessment and Plan: 1. Insomnia, persistent Add and taper up trazodone Taper and d/c lorazepam if possible - traZODone (DESYREL) 50 MG tablet; Take 0.5-2 tablets (25-100 mg total) by mouth at bedtime.  Dispense: 60 tablet; Refill: 0 - LORazepam (ATIVAN) 1 MG tablet; Take 2 tablets (2 mg total) by mouth every 8 (eight) hours as needed for anxiety.  Dispense: 90 tablet; Refill: 5  2. Hx of breast cancer Order for Mammogram at [facility] given  3. Weight gain finding Continue exercise and healthy diet Will consider pharmacologic therapy   Meds ordered this encounter  Medications  .  traZODone (DESYREL) 50 MG tablet    Sig: Take 0.5-2 tablets (25-100 mg total) by mouth at bedtime.    Dispense:  60 tablet    Refill:  0  . LORazepam (ATIVAN) 1 MG tablet    Sig: Take 2 tablets (2 mg total) by mouth every 8 (eight) hours as needed for anxiety.    Dispense:  90 tablet    Refill:  5    Partially dictated using Editor, commissioning. Any errors are unintentional.  Halina Maidens, MD [facility]  03/13/2017

## 2017-03-14 ENCOUNTER — Other Ambulatory Visit: Payer: Self-pay | Admitting: Internal Medicine

## 2017-03-14 ENCOUNTER — Telehealth: Payer: Self-pay

## 2017-03-14 DIAGNOSIS — G4700 Insomnia, unspecified: Secondary | ICD-10-CM

## 2017-03-14 MED ORDER — LORAZEPAM 1 MG PO TABS: 1.0000 mg | ORAL_TABLET | Freq: Three times a day (TID) | ORAL | 5 refills | Status: DC | PRN

## 2017-03-14 NOTE — Telephone Encounter (Signed)
Since the total number per month is 56 - she can take it how she wants.  I do not need to change the instructions.

## 2017-03-14 NOTE — Telephone Encounter (Signed)
Patient called stating lorazepam was sent in incorrectly. Stated she takes 1 mg in the morning and 2mg  at bedtime. Wants sent to [facility] in [place]. Please Advise.

## 2017-03-14 NOTE — Telephone Encounter (Signed)
Called patient and informed to take medication as was taking it. And call when refill needed.

## 2017-03-18 ENCOUNTER — Other Ambulatory Visit: Payer: Self-pay | Admitting: Internal Medicine

## 2017-03-18 MED ORDER — LORAZEPAM 0.5 MG PO TABS: 2.0000 mg | ORAL_TABLET | Freq: Every day | ORAL | 5 refills | Status: DC

## 2017-04-18 DIAGNOSIS — Z9012 Acquired absence of left breast and nipple: Secondary | ICD-10-CM | POA: Diagnosis not present

## 2017-04-18 DIAGNOSIS — Z1231 Encounter for screening mammogram for malignant neoplasm of breast: Secondary | ICD-10-CM | POA: Diagnosis not present

## 2017-04-18 DIAGNOSIS — Z853 Personal history of malignant neoplasm of breast: Secondary | ICD-10-CM | POA: Diagnosis not present

## 2017-04-18 DIAGNOSIS — Z9882 Breast implant status: Secondary | ICD-10-CM | POA: Diagnosis not present

## 2017-05-13 ENCOUNTER — Other Ambulatory Visit: Payer: Self-pay | Admitting: Internal Medicine

## 2017-05-13 DIAGNOSIS — G4700 Insomnia, unspecified: Secondary | ICD-10-CM

## 2017-05-30 DIAGNOSIS — H04123 Dry eye syndrome of bilateral lacrimal glands: Secondary | ICD-10-CM | POA: Diagnosis not present

## 2017-05-30 DIAGNOSIS — H35371 Puckering of macula, right eye: Secondary | ICD-10-CM | POA: Diagnosis not present

## 2017-05-30 DIAGNOSIS — H5203 Hypermetropia, bilateral: Secondary | ICD-10-CM | POA: Diagnosis not present

## 2017-06-06 DIAGNOSIS — L718 Other rosacea: Secondary | ICD-10-CM | POA: Diagnosis not present

## 2017-06-06 DIAGNOSIS — L820 Inflamed seborrheic keratosis: Secondary | ICD-10-CM | POA: Diagnosis not present

## 2017-06-06 DIAGNOSIS — L570 Actinic keratosis: Secondary | ICD-10-CM | POA: Diagnosis not present

## 2017-06-06 DIAGNOSIS — Z85828 Personal history of other malignant neoplasm of skin: Secondary | ICD-10-CM | POA: Diagnosis not present

## 2017-06-06 DIAGNOSIS — Z08 Encounter for follow-up examination after completed treatment for malignant neoplasm: Secondary | ICD-10-CM | POA: Diagnosis not present

## 2017-06-06 DIAGNOSIS — D225 Melanocytic nevi of trunk: Secondary | ICD-10-CM | POA: Diagnosis not present

## 2017-07-15 ENCOUNTER — Other Ambulatory Visit: Payer: Self-pay | Admitting: Internal Medicine

## 2017-07-15 DIAGNOSIS — G4700 Insomnia, unspecified: Secondary | ICD-10-CM

## 2017-08-01 ENCOUNTER — Ambulatory Visit: Payer: BLUE CROSS/BLUE SHIELD | Admitting: Internal Medicine

## 2017-08-01 ENCOUNTER — Encounter: Payer: Self-pay | Admitting: Internal Medicine

## 2017-08-01 VITALS — BP 118/82 | HR 78 | Temp 99.0°F | Resp 16 | Ht 62.0 in

## 2017-08-01 DIAGNOSIS — H1045 Other chronic allergic conjunctivitis: Secondary | ICD-10-CM | POA: Insufficient documentation

## 2017-08-01 DIAGNOSIS — R69 Illness, unspecified: Secondary | ICD-10-CM

## 2017-08-01 DIAGNOSIS — H43819 Vitreous degeneration, unspecified eye: Secondary | ICD-10-CM | POA: Insufficient documentation

## 2017-08-01 DIAGNOSIS — J111 Influenza due to unidentified influenza virus with other respiratory manifestations: Secondary | ICD-10-CM

## 2017-08-01 DIAGNOSIS — H04123 Dry eye syndrome of bilateral lacrimal glands: Secondary | ICD-10-CM | POA: Insufficient documentation

## 2017-08-01 LAB — POCT INFLUENZA A/B
Influenza A, POC: NEGATIVE
Influenza B, POC: NEGATIVE

## 2017-08-01 MED ORDER — HYDROCOD POLST-CPM POLST ER 10-8 MG/5ML PO SUER: 5.0000 mL | Freq: Two times a day (BID) | ORAL | 0 refills | Status: DC

## 2017-08-01 MED ORDER — OSELTAMIVIR PHOSPHATE 75 MG PO CAPS: 75.0000 mg | ORAL_CAPSULE | Freq: Two times a day (BID) | ORAL | 0 refills | Status: DC

## 2017-08-01 NOTE — Progress Notes (Addendum)
Date:  08/01/2017   Name:  Joanne Lopez   DOB:  03/28/1961   MRN:  [id number]   Chief Complaint: Cough (Started feeling bad 9:00 pm Wed night ); Fever; and Headache  Cough  This is a new problem. The current episode started yesterday. The problem has been unchanged. The problem occurs hourly. The cough is non-productive. Associated symptoms include chills, a fever, headaches, a sore throat and shortness of breath. Pertinent negatives include no chest pain, nasal congestion, postnasal drip or wheezing.      Review of Systems  Constitutional: Positive for chills, fatigue and fever.  HENT: Positive for sore throat. Negative for congestion, postnasal drip, sinus pressure, sinus pain and trouble swallowing.   Respiratory: Positive for cough and shortness of breath. Negative for chest tightness and wheezing.   Cardiovascular: Negative for chest pain, palpitations and leg swelling.  Gastrointestinal: Negative for diarrhea, nausea and vomiting.  Neurological: Positive for headaches. Negative for dizziness.  Hematological: Negative for adenopathy.  Psychiatric/Behavioral: Negative for sleep disturbance.    Patient Active Problem List   Diagnosis Date Noted  . Chronic allergic conjunctivitis 08/01/2017  . Dry eyes 08/01/2017  . Vitreous degeneration 08/01/2017  . Weight gain finding 03/13/2017  . Anxiety disorder 08/29/2016  . Xerosis of skin 01/12/2016  . Hyperlipidemia, mild 09/25/2015  . Muscle spasms of head or neck 09/25/2015  . Atrophic vaginitis 12/22/2014  . Decreased libido 12/22/2014  . Insomnia, persistent 12/22/2014  . Hx of breast cancer 12/22/2014  . Plantar fasciitis 12/22/2014  . 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 Authorizing Provider  acetaminophen (TYLENOL) 325 MG tablet Take 650 mg by mouth every 6 (six) hours  as needed.   Yes [provider]  Biotin 5000 MCG CAPS Take 10,000 mcg by mouth daily.   Yes [provider]  celecoxib (CELEBREX) 200 MG capsule Take 1 capsule by mouth 2 times daily. 09/25/16  Yes Glean Hess, MD  ibuprofen (ADVIL,MOTRIN) 600 MG tablet Take 1 tablet (600 mg total) by mouth 2 (two) times daily as needed. 01/09/17  Yes Glean Hess, MD  LORazepam (ATIVAN) 0.5 MG tablet Take 4 tablets (2 mg total) by mouth at bedtime. And 1 mg qAM PRN 03/18/17  Yes Glean Hess, MD  Multiple Vitamins tablet Take 1 tablet by mouth daily at 2 PM daily at 2 PM.   Yes [provider]  OSPHENA 60 MG TABS TAKE ONE TABLET BY MOUTH EVERY DAY. 11/20/16  Yes Glean Hess, MD  PREMARIN vaginal cream Place vaginally at bedtime. Use three times per week 03/14/17  Yes Glean Hess, MD  RESTASIS 0.05 % ophthalmic emulsion INSTILL 1 DROP INTO AFFECTED EYE(S) BY OPHTHALMIC ROUTE EVERY 12 HOURS 08/18/15  Yes [provider]  traZODone (DESYREL) 50 MG tablet TAKE 1/2 TO 2 TABLETS BY MOUTH AT BEDTIME. 07/15/17  Yes Glean Hess, MD    Allergies  Allergen Reactions  . Codeine Hives  . Levofloxacin Hives  . Oxycodone Hives  . Prednisone Other (See Comments)  . Penicillins Rash and Hives    Past Surgical History:  Procedure Laterality Date  . ABDOMINAL HYSTERECTOMY  2009   total hysterectomy  . BREAST ENHANCEMENT SURGERY  2008, 2009  . LAPAROSCOPIC APPENDECTOMY  02/02/2016  . MASTECTOMY  2008    Social History   Tobacco  Use  . Smoking status: Never Smoker  . Smokeless tobacco: Never Used  Substance Use Topics  . Alcohol use: No    Alcohol/week: 0.0 oz  . Drug use: No     Medication list has been reviewed and updated.  PHQ 2/9 Scores 03/13/2017  PHQ - 2 Score 0    Physical Exam  Constitutional: She is oriented to person, place, and time. She appears well-developed. She has a sickly appearance. No distress.  HENT:  Head:  Normocephalic and atraumatic.  Right Ear: Tympanic membrane and ear canal normal.  Left Ear: Tympanic membrane and ear canal normal.  Nose: Right sinus exhibits no maxillary sinus tenderness. Left sinus exhibits no maxillary sinus tenderness.  Mouth/Throat: No posterior oropharyngeal erythema.  Neck: Normal range of motion. Neck supple. No thyromegaly present.  Cardiovascular: Normal rate, regular rhythm and normal heart sounds.  Pulmonary/Chest: Effort normal and breath sounds normal. No respiratory distress. She has no wheezes.  Musculoskeletal: Normal range of motion.  Neurological: She is alert and oriented to person, place, and time.  Skin: Skin is warm and dry. No rash noted.  Psychiatric: She has a normal mood and affect. Her behavior is normal. Thought content normal.  Nursing note and vitals reviewed.   BP 118/82   Pulse 78   Temp 99 F (37.2 C) (Oral)   Resp 16   Ht 5\' 2"  (1.575 m)   SpO2 98%   BMI 28.90 kg/m   Assessment and Plan: 1. Influenza-like illness Continue tylenol, fluids and rest - POCT Influenza A/B - chlorpheniramine-HYDROcodone (TUSSIONEX PENNKINETIC ER) 10-8 MG/5ML SUER; Take 5 mLs by mouth 2 (two) times daily.  Dispense: 115 mL; Refill: 0 - oseltamivir (TAMIFLU) 75 MG capsule; Take 1 capsule (75 mg total) by mouth 2 (two) times daily.  Dispense: 10 capsule; Refill: 0   Meds ordered this encounter  Medications  . chlorpheniramine-HYDROcodone (TUSSIONEX PENNKINETIC ER) 10-8 MG/5ML SUER    Sig: Take 5 mLs by mouth 2 (two) times daily.    Dispense:  115 mL    Refill:  0  . oseltamivir (TAMIFLU) 75 MG capsule    Sig: Take 1 capsule (75 mg total) by mouth 2 (two) times daily.    Dispense:  10 capsule    Refill:  0    Partially dictated using Editor, commissioning. Any errors are unintentional.  Halina Maidens, MD [facility]  08/01/2017

## 2017-09-04 ENCOUNTER — Ambulatory Visit: Payer: BLUE CROSS/BLUE SHIELD | Admitting: Internal Medicine

## 2017-09-04 ENCOUNTER — Encounter: Payer: Self-pay | Admitting: Internal Medicine

## 2017-09-04 VITALS — BP 124/80 | HR 80 | Temp 97.7°F | Ht 62.0 in | Wt 158.0 lb

## 2017-09-04 DIAGNOSIS — R05 Cough: Secondary | ICD-10-CM

## 2017-09-04 DIAGNOSIS — F064 Anxiety disorder due to known physiological condition: Secondary | ICD-10-CM | POA: Diagnosis not present

## 2017-09-04 DIAGNOSIS — R059 Cough, unspecified: Secondary | ICD-10-CM

## 2017-09-04 DIAGNOSIS — G4700 Insomnia, unspecified: Secondary | ICD-10-CM | POA: Diagnosis not present

## 2017-09-04 MED ORDER — LORAZEPAM 0.5 MG PO TABS: 2.0000 mg | ORAL_TABLET | Freq: Every day | ORAL | 5 refills | Status: DC

## 2017-09-04 MED ORDER — AZITHROMYCIN 250 MG PO TABS: ORAL_TABLET | ORAL | 0 refills | Status: AC

## 2017-09-04 NOTE — Progress Notes (Signed)
Date:  09/04/2017   Name:  Joanne Lopez   DOB:  10-15-1960   MRN:  [id number]   Chief Complaint: Cough (Had X 1 month. Dry cough. No production. Feels dried out but drinking lots of water. No other symptoms. )  Cough  This is a new problem. The current episode started 1 to 4 weeks ago. The problem has been unchanged. The problem occurs every few minutes. The cough is non-productive. Pertinent negatives include no chest pain, chills, fever, headaches, sore throat, shortness of breath or sweats. Risk factors: had influenza a month ago. She has tried nothing for the symptoms.   Insomnia/anxiety - taking ativan 2 mg at hs and 1 mg during the day if needed.  Trazodone helped with sleep but it made her eyes too dry. She is thinking about trying CBD oil.  Review of Systems  Constitutional: Positive for fatigue. Negative for chills, diaphoresis and fever.  HENT: Negative for sore throat and trouble swallowing.   Respiratory: Positive for cough. Negative for chest tightness and shortness of breath.   Cardiovascular: Negative for chest pain, palpitations and leg swelling.  Neurological: Negative for dizziness and headaches.    Patient Active Problem List   Diagnosis Date Noted  . Chronic allergic conjunctivitis 08/01/2017  . Dry eyes 08/01/2017  . Vitreous degeneration 08/01/2017  . Weight gain finding 03/13/2017  . Anxiety disorder 08/29/2016  . Xerosis of skin 01/12/2016  . Hyperlipidemia, mild 09/25/2015  . Muscle spasms of head or neck 09/25/2015  . Atrophic vaginitis 12/22/2014  . Decreased libido 12/22/2014  . Insomnia, persistent 12/22/2014  . Hx of breast cancer 12/22/2014  . Plantar fasciitis 12/22/2014  . 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 Authorizing Provider  acetaminophen (TYLENOL) 325 MG tablet Take 650 mg by mouth every  6 (six) hours as needed.   Yes [provider]  Biotin 5000 MCG CAPS Take 10,000 mcg by mouth daily.   Yes [provider]  celecoxib (CELEBREX) 200 MG capsule Take 1 capsule by mouth 2 times daily. 09/25/16  Yes Glean Hess, MD  ibuprofen (ADVIL,MOTRIN) 600 MG tablet Take 1 tablet (600 mg total) by mouth 2 (two) times daily as needed. 01/09/17  Yes Glean Hess, MD  LORazepam (ATIVAN) 0.5 MG tablet Take 4 tablets (2 mg total) by mouth at bedtime. And 1 mg qAM PRN 03/18/17  Yes Glean Hess, MD  Multiple Vitamins tablet Take 1 tablet by mouth daily at 2 PM daily at 2 PM.   Yes [provider]  OSPHENA 60 MG TABS TAKE ONE TABLET BY MOUTH EVERY DAY. 11/20/16  Yes Glean Hess, MD  PREMARIN vaginal cream Place vaginally at bedtime. Use three times per week 03/14/17  Yes Glean Hess, MD  RESTASIS 0.05 % ophthalmic emulsion INSTILL 1 DROP INTO AFFECTED EYE(S) BY OPHTHALMIC ROUTE EVERY 12 HOURS 08/18/15  Yes [provider]  traZODone (DESYREL) 50 MG tablet TAKE 1/2 TO 2 TABLETS BY MOUTH AT BEDTIME. 07/15/17  Yes Glean Hess, MD    Allergies  Allergen Reactions  . Codeine Hives  . Levofloxacin Hives  . Oxycodone Hives  . Prednisone Other (See Comments)  . Penicillins Rash and Hives    Past Surgical History:  Procedure Laterality Date  . ABDOMINAL HYSTERECTOMY  2009   total hysterectomy  . BREAST  ENHANCEMENT SURGERY  2008, 2009  . LAPAROSCOPIC APPENDECTOMY  02/02/2016  . MASTECTOMY  2008    Social History   Tobacco Use  . Smoking status: Never Smoker  . Smokeless tobacco: Never Used  Substance Use Topics  . Alcohol use: No    Alcohol/week: 0.0 oz  . Drug use: No     Medication list has been reviewed and updated.  PHQ 2/9 Scores 03/13/2017  PHQ - 2 Score 0    Physical Exam  Constitutional: She is oriented to person, place, and time. She appears well-developed. No distress.  HENT:  Head: Normocephalic and  atraumatic.  Right Ear: Tympanic membrane and ear canal normal.  Left Ear: Tympanic membrane and ear canal normal.  Nose: Right sinus exhibits no maxillary sinus tenderness. Left sinus exhibits no maxillary sinus tenderness.  Mouth/Throat: No posterior oropharyngeal edema or posterior oropharyngeal erythema.  Neck: Normal range of motion. Neck supple.  Cardiovascular: Normal rate, regular rhythm and normal heart sounds.  Pulmonary/Chest: Effort normal and breath sounds normal. No respiratory distress. She has no wheezes.  Musculoskeletal: Normal range of motion. She exhibits no edema or tenderness.  Neurological: She is alert and oriented to person, place, and time.  Skin: Skin is warm and dry. No rash noted.  Psychiatric: She has a normal mood and affect. Her behavior is normal. Thought content normal.  Nursing note and vitals reviewed.   BP 124/80   Pulse 80   Temp 97.7 F (36.5 C) (Oral)   Ht 5\' 2"  (1.575 m)   Wt 158 lb (71.7 kg)   SpO2 97%   BMI 28.90 kg/m   Assessment and Plan: 1. Cough Possible atypical bacterial infection following flu - azithromycin (ZITHROMAX Z-PAK) 250 MG tablet; UAD  Dispense: 6 each; Refill: 0  2. Insomnia, persistent Refill medications - LORazepam (ATIVAN) 0.5 MG tablet; Take 4 tablets (2 mg total) by mouth at bedtime. And 1 mg qAM PRN  Dispense: 180 tablet; Refill: 5  3. Anxiety disorder due to known physiological condition Consider CBD oil   Meds ordered this encounter  Medications  . azithromycin (ZITHROMAX Z-PAK) 250 MG tablet    Sig: UAD    Dispense:  6 each    Refill:  0  . LORazepam (ATIVAN) 0.5 MG tablet    Sig: Take 4 tablets (2 mg total) by mouth at bedtime. And 1 mg qAM PRN    Dispense:  180 tablet    Refill:  5    Partially dictated using Editor, commissioning. Any errors are unintentional.  Halina Maidens, MD [facility]  09/04/2017

## 2017-09-23 ENCOUNTER — Ambulatory Visit (INDEPENDENT_AMBULATORY_CARE_PROVIDER_SITE_OTHER): Payer: BLUE CROSS/BLUE SHIELD | Admitting: Internal Medicine

## 2017-09-23 ENCOUNTER — Ambulatory Visit
Admission: RE | Admit: 2017-09-23 | Discharge: 2017-09-23 | Disposition: A | Discharge status: Home / Self Care | Payer: BLUE CROSS/BLUE SHIELD | Source: Ambulatory Visit | Attending: Internal Medicine | Admitting: Internal Medicine

## 2017-09-23 ENCOUNTER — Encounter: Payer: Self-pay | Admitting: Internal Medicine

## 2017-09-23 VITALS — BP 120/82 | HR 80 | Ht 62.0 in

## 2017-09-23 DIAGNOSIS — Z853 Personal history of malignant neoplasm of breast: Secondary | ICD-10-CM

## 2017-09-23 DIAGNOSIS — G4700 Insomnia, unspecified: Secondary | ICD-10-CM | POA: Diagnosis not present

## 2017-09-23 DIAGNOSIS — Z1159 Encounter for screening for other viral diseases: Secondary | ICD-10-CM

## 2017-09-23 DIAGNOSIS — M722 Plantar fascial fibromatosis: Secondary | ICD-10-CM

## 2017-09-23 DIAGNOSIS — N952 Postmenopausal atrophic vaginitis: Secondary | ICD-10-CM

## 2017-09-23 DIAGNOSIS — E785 Hyperlipidemia, unspecified: Secondary | ICD-10-CM

## 2017-09-23 DIAGNOSIS — R05 Cough: Secondary | ICD-10-CM | POA: Diagnosis not present

## 2017-09-23 LAB — POCT URINALYSIS DIPSTICK
BILIRUBIN UA: NEGATIVE
Blood, UA: NEGATIVE
Glucose, UA: NEGATIVE
KETONES UA: NEGATIVE
Leukocytes, UA: NEGATIVE
Nitrite, UA: NEGATIVE
PH UA: 6.5 (ref 5.0–8.0)
Protein, UA: NEGATIVE
Spec Grav, UA: 1.02 (ref 1.010–1.025)
UROBILINOGEN UA: 0.2 U/dL

## 2017-09-23 MED ORDER — MONTELUKAST SODIUM 10 MG PO TABS: 10.0000 mg | ORAL_TABLET | Freq: Every day | ORAL | 0 refills | Status: DC

## 2017-09-23 MED ORDER — CELECOXIB 200 MG PO CAPS: 200.0000 mg | ORAL_CAPSULE | Freq: Every day | ORAL | 12 refills | Status: DC | PRN

## 2017-09-23 NOTE — Progress Notes (Signed)
Date:  09/23/2017   Name:  Joanne Lopez   DOB:  Jun 11, 1960   MRN:  [id number]   Chief Complaint: No chief complaint on file.   She does not need Pap due to total hysterectomy.  Her mammogram on right nipple was normal in 04/2017. She does not want to have CPX since she does not need Pap or breast exam.   Insomnia  Primary symptoms: sleep disturbance.  The onset quality is gradual. The problem occurs nightly. The problem is unchanged. Past treatments include medication. The treatment provided moderate relief.  Cough  This is a recurrent problem. The problem has been waxing and waning. The problem occurs every few hours. The cough is non-productive. Pertinent negatives include no chest pain, chills, fever, headaches, rash, shortness of breath or wheezing. Treatments tried: cant take antihistamines due to dry eyes. Her past medical history is significant for environmental allergies.  Plantar fasciits - still has chronic issues, alternated celebrex and advil, taking something about 3 times per week. Breast Cancer - stills sees Oncology - Dr. Ella Jubilee [place]  She needs to have CXR and has been recommended to have breast MRI because of the implants.  She had a right mammogram in November. Mild hyperlipidemia - on no medications but trying to follow low fat diet and exercise.    Review of Systems  Constitutional: Negative for chills, fatigue and fever.  HENT: Negative for congestion, hearing loss, tinnitus, trouble swallowing and voice change.   Eyes: Negative for visual disturbance.  Respiratory: Positive for cough. Negative for chest tightness, shortness of breath and wheezing.   Cardiovascular: Negative for chest pain, palpitations and leg swelling.  Gastrointestinal: Negative for abdominal pain, constipation, diarrhea and vomiting.  Endocrine: Negative for polydipsia and polyuria.  Genitourinary: Positive for dyspareunia. Negative for dysuria, frequency, genital  sores, vaginal bleeding and vaginal discharge.  Musculoskeletal: Positive for arthralgias (foot pain). Negative for gait problem and joint swelling.  Skin: Negative for color change and rash.       Dry skin and eyes  Allergic/Immunologic: Positive for environmental allergies. Negative for food allergies.  Neurological: Negative for dizziness, tremors, light-headedness and headaches.  Hematological: Negative for adenopathy. Does not bruise/bleed easily.  Psychiatric/Behavioral: Positive for sleep disturbance. Negative for dysphoric mood. The patient has insomnia. The patient is not nervous/anxious.     Patient Active Problem List   Diagnosis Date Noted  . Chronic allergic conjunctivitis 08/01/2017  . Dry eyes 08/01/2017  . Vitreous degeneration 08/01/2017  . Weight gain finding 03/13/2017  . Anxiety disorder 08/29/2016  . Xerosis of skin 01/12/2016  . Hyperlipidemia, mild 09/25/2015  . Muscle spasms of head or neck 09/25/2015  . Atrophic vaginitis 12/22/2014  . Decreased libido 12/22/2014  . Insomnia, persistent 12/22/2014  . Hx of breast cancer 12/22/2014  . Plantar fasciitis 12/22/2014    Prior to Admission medications   Medication Sig Start Date End Date Taking? Authorizing Provider  acetaminophen (TYLENOL) 325 MG tablet Take 650 mg by mouth every 6 (six) hours as needed.    [provider]  Biotin 5000 MCG CAPS Take 10,000 mcg by mouth daily.    [provider]  celecoxib (CELEBREX) 200 MG capsule Take 1 capsule by mouth 2 times daily. 09/25/16   Glean Hess, MD  ibuprofen (ADVIL,MOTRIN) 600 MG tablet Take 1 tablet (600 mg total) by mouth 2 (two) times daily as needed. 01/09/17   Glean Hess, MD  LORazepam Francee Gentile)  0.5 MG tablet Take 4 tablets (2 mg total) by mouth at bedtime. And 1 mg qAM PRN 09/04/17   Glean Hess, MD  Multiple Vitamins tablet Take 1 tablet by mouth daily at 2 PM daily at 2 PM.    [provider]  OSPHENA 60 MG TABS TAKE  ONE TABLET BY MOUTH EVERY DAY. 11/20/16   Glean Hess, MD  PREMARIN vaginal cream Place vaginally at bedtime. Use three times per week 03/14/17   Glean Hess, MD  RESTASIS 0.05 % ophthalmic emulsion INSTILL 1 DROP INTO AFFECTED EYE(S) BY OPHTHALMIC ROUTE EVERY 12 HOURS 08/18/15   [provider]    Allergies  Allergen Reactions  . Tamiflu [Oseltamivir] Nausea Only  . Codeine Hives  . Levofloxacin Hives  . Oxycodone Hives  . Prednisone Other (See Comments)  . Penicillins Rash and Hives    Past Surgical History:  Procedure Laterality Date  . ABDOMINAL HYSTERECTOMY  2009   total hysterectomy  . BREAST ENHANCEMENT SURGERY  2008, 2009  . COLONOSCOPY  04/15/2013  . LAPAROSCOPIC APPENDECTOMY  02/02/2016  . MASTECTOMY Bilateral 2008 and 2010    Social History   Tobacco Use  . Smoking status: Never Smoker  . Smokeless tobacco: Never Used  Substance Use Topics  . Alcohol use: No    Alcohol/week: 0.0 oz  . Drug use: No     Medication list has been reviewed and updated.  PHQ 2/9 Scores 03/13/2017  PHQ - 2 Score 0    Physical Exam  Constitutional: She is oriented to person, place, and time. She appears well-developed. No distress.  HENT:  Head: Normocephalic and atraumatic.  Neck: Normal range of motion. Neck supple.  Cardiovascular: Normal rate, regular rhythm and normal heart sounds.  Pulmonary/Chest: Effort normal and breath sounds normal. No respiratory distress. She has no wheezes.  Abdominal: Soft. Bowel sounds are normal. She exhibits no distension. There is no tenderness.  Musculoskeletal: Normal range of motion.  Lymphadenopathy:    She has no cervical adenopathy.  Neurological: She is alert and oriented to person, place, and time.  Skin: Skin is warm and dry. No rash noted.  Psychiatric: She has a normal mood and affect. Her behavior is normal. Thought content normal.    BP 120/82   Pulse 80   Ht 5\' 2"  (1.575 m)   SpO2 99%   BMI 28.90  kg/m   Assessment and Plan: 1. Insomnia, persistent Continue Ativan and CBD oil - TSH - POCT urinalysis dipstick  2. Hyperlipidemia, mild Check labs and advise - Comprehensive metabolic panel - Lipid panel  3. Hx of breast cancer Contact Oncology about MRI Will order CXR today  4. Atrophic vaginitis Continue Osphenia - CBC with Differential/Platelet  5. Need for hepatitis C screening test - Hepatitis C antibody  6. Plantar fasciitis - celecoxib (CELEBREX) 200 MG capsule; Take 1 capsule (200 mg total) by mouth daily as needed.  Dispense: 60 capsule; Refill: 12   Meds ordered this encounter  Medications  . celecoxib (CELEBREX) 200 MG capsule    Sig: Take 1 capsule (200 mg total) by mouth daily as needed.    Dispense:  60 capsule    Refill:  12    Partially dictated using Editor, commissioning. Any errors are unintentional.  Halina Maidens, MD [facility]  09/23/2017

## 2017-09-23 NOTE — Patient Instructions (Signed)

## 2017-09-24 ENCOUNTER — Other Ambulatory Visit: Payer: Self-pay

## 2017-09-24 DIAGNOSIS — M722 Plantar fascial fibromatosis: Secondary | ICD-10-CM

## 2017-09-24 LAB — COMPREHENSIVE METABOLIC PANEL
ALBUMIN: 4.5 g/dL (ref 3.5–5.5)
ALT: 17 IU/L (ref 0–32)
AST: 22 IU/L (ref 0–40)
Albumin/Globulin Ratio: 1.8 (ref 1.2–2.2)
Alkaline Phosphatase: 75 IU/L (ref 39–117)
BUN / CREAT RATIO: 21 (ref 9–23)
BUN: 18 mg/dL (ref 6–24)
Bilirubin Total: 0.7 mg/dL (ref 0.0–1.2)
CALCIUM: 9.4 mg/dL (ref 8.7–10.2)
CO2: 25 mmol/L (ref 20–29)
CREATININE: 0.87 mg/dL (ref 0.57–1.00)
Chloride: 103 mmol/L (ref 96–106)
GFR, EST AFRICAN AMERICAN: 85 mL/min/{1.73_m2} (ref >59)
GFR, EST NON AFRICAN AMERICAN: 74 mL/min/{1.73_m2} (ref >59)
GLUCOSE: 83 mg/dL (ref 65–99)
Globulin, Total: 2.5 g/dL (ref 1.5–4.5)
Potassium: 4 mmol/L (ref 3.5–5.2)
Sodium: 141 mmol/L (ref 134–144)
TOTAL PROTEIN: 7 g/dL (ref 6.0–8.5)

## 2017-09-24 LAB — CBC WITH DIFFERENTIAL/PLATELET
Basophils Absolute: 0.1 10*3/uL (ref 0.0–0.2)
Basos: 2 %
EOS (ABSOLUTE): 0.1 10*3/uL (ref 0.0–0.4)
EOS: 2 %
HEMATOCRIT: 49.2 % — AB (ref 34.0–46.6)
Hemoglobin: 16.4 g/dL — ABNORMAL HIGH (ref 11.1–15.9)
Immature Grans (Abs): 0 10*3/uL (ref 0.0–0.1)
Immature Granulocytes: 0 %
LYMPHS ABS: 2.2 10*3/uL (ref 0.7–3.1)
Lymphs: 35 %
MCH: 30.7 pg (ref 26.6–33.0)
MCHC: 33.3 g/dL (ref 31.5–35.7)
MCV: 92 fL (ref 79–97)
Monocytes Absolute: 0.7 10*3/uL (ref 0.1–0.9)
Monocytes: 11 %
Neutrophils Absolute: 3.2 10*3/uL (ref 1.4–7.0)
Neutrophils: 50 %
Platelets: 275 10*3/uL (ref 150–379)
RBC: 5.34 x10E6/uL — AB (ref 3.77–5.28)
RDW: 13.2 % (ref 12.3–15.4)
WBC: 6.4 10*3/uL (ref 3.4–10.8)

## 2017-09-24 LAB — LIPID PANEL
CHOL/HDL RATIO: 2.6 ratio (ref 0.0–4.4)
Cholesterol, Total: 220 mg/dL — ABNORMAL HIGH (ref 100–199)
HDL: 85 mg/dL (ref >39)
LDL CALC: 120 mg/dL — AB (ref 0–99)
TRIGLYCERIDES: 74 mg/dL (ref 0–149)
VLDL CHOLESTEROL CAL: 15 mg/dL (ref 5–40)

## 2017-09-24 LAB — HEPATITIS C ANTIBODY: Hep C Virus Ab: <0.1 s/co ratio (ref 0.0–0.9)

## 2017-09-24 LAB — TSH: TSH: 3.21 u[IU]/mL (ref 0.450–4.500)

## 2017-09-24 MED ORDER — CELECOXIB 200 MG PO CAPS: 200.0000 mg | ORAL_CAPSULE | Freq: Two times a day (BID) | ORAL | 12 refills | Status: DC

## 2017-10-31 ENCOUNTER — Other Ambulatory Visit: Payer: Self-pay | Admitting: Internal Medicine

## 2017-12-19 ENCOUNTER — Other Ambulatory Visit: Payer: Self-pay | Admitting: Internal Medicine

## 2017-12-29 DIAGNOSIS — M79671 Pain in right foot: Secondary | ICD-10-CM | POA: Diagnosis not present

## 2018-01-05 DIAGNOSIS — M76821 Posterior tibial tendinitis, right leg: Secondary | ICD-10-CM | POA: Diagnosis not present

## 2018-01-05 DIAGNOSIS — M79671 Pain in right foot: Secondary | ICD-10-CM | POA: Diagnosis not present

## 2018-01-05 DIAGNOSIS — M722 Plantar fascial fibromatosis: Secondary | ICD-10-CM | POA: Diagnosis not present

## 2018-01-05 DIAGNOSIS — M779 Enthesopathy, unspecified: Secondary | ICD-10-CM | POA: Diagnosis not present

## 2018-02-09 ENCOUNTER — Other Ambulatory Visit: Payer: Self-pay | Admitting: Internal Medicine

## 2018-02-09 DIAGNOSIS — G4700 Insomnia, unspecified: Secondary | ICD-10-CM

## 2018-05-15 ENCOUNTER — Telehealth: Payer: Self-pay

## 2018-05-15 ENCOUNTER — Other Ambulatory Visit: Payer: Self-pay | Admitting: Internal Medicine

## 2018-05-15 MED ORDER — LORAZEPAM 0.5 MG PO TABS: 1.0000 mg | ORAL_TABLET | Freq: Every day | ORAL | 0 refills | Status: AC

## 2018-05-15 NOTE — Telephone Encounter (Signed)
Dropped Lorazapam in sink water. Needs 10 days worth sent to [facility]. Advised may need to pay cash as we have no control over insurance.

## 2018-06-03 HISTORY — PX: YAG LASER APPLICATION: SHX6189

## 2018-06-09 DIAGNOSIS — H2513 Age-related nuclear cataract, bilateral: Secondary | ICD-10-CM | POA: Diagnosis not present

## 2018-06-09 DIAGNOSIS — M3501 Sicca syndrome with keratoconjunctivitis: Secondary | ICD-10-CM | POA: Diagnosis not present

## 2018-06-09 DIAGNOSIS — H40033 Anatomical narrow angle, bilateral: Secondary | ICD-10-CM | POA: Diagnosis not present

## 2018-06-17 DIAGNOSIS — H2513 Age-related nuclear cataract, bilateral: Secondary | ICD-10-CM | POA: Diagnosis not present

## 2018-06-17 DIAGNOSIS — H40033 Anatomical narrow angle, bilateral: Secondary | ICD-10-CM | POA: Diagnosis not present

## 2018-06-24 DIAGNOSIS — H40033 Anatomical narrow angle, bilateral: Secondary | ICD-10-CM | POA: Diagnosis not present

## 2018-07-14 ENCOUNTER — Other Ambulatory Visit: Payer: Self-pay | Admitting: Internal Medicine

## 2018-07-14 DIAGNOSIS — G4700 Insomnia, unspecified: Secondary | ICD-10-CM

## 2018-07-15 DIAGNOSIS — M3501 Sicca syndrome with keratoconjunctivitis: Secondary | ICD-10-CM | POA: Diagnosis not present

## 2018-07-15 DIAGNOSIS — H35371 Puckering of macula, right eye: Secondary | ICD-10-CM | POA: Diagnosis not present

## 2018-08-19 ENCOUNTER — Telehealth: Payer: Self-pay

## 2018-08-19 NOTE — Telephone Encounter (Signed)
Patient called saying she is wondering if she can get a 90 day Rx sent in to her pharmacy or if she can have a written script to take with her on her trip in [place] for Ativan.  Spoke with Dr Army Melia and informed the patient we cannot do this BUT she can call us when she gets to [place] and let us know a pharmacy to send her Rx to when she is about to run out. She asked if she can have at least a 60 day supply and I let her know we cannot do that.   She verbalized understanding. Will call when running low and try to find a pharmacy at her location.

## 2018-09-07 ENCOUNTER — Other Ambulatory Visit: Payer: Self-pay | Admitting: Internal Medicine

## 2018-09-22 IMAGING — CR DG CHEST 2V
2 series · 2 of 2 positions shown · non-contrast
Comparison: None.

CLINICAL DATA: Cough.  History of breast cancer.

EXAM:
CHEST - 2 VIEW

[chest pa]
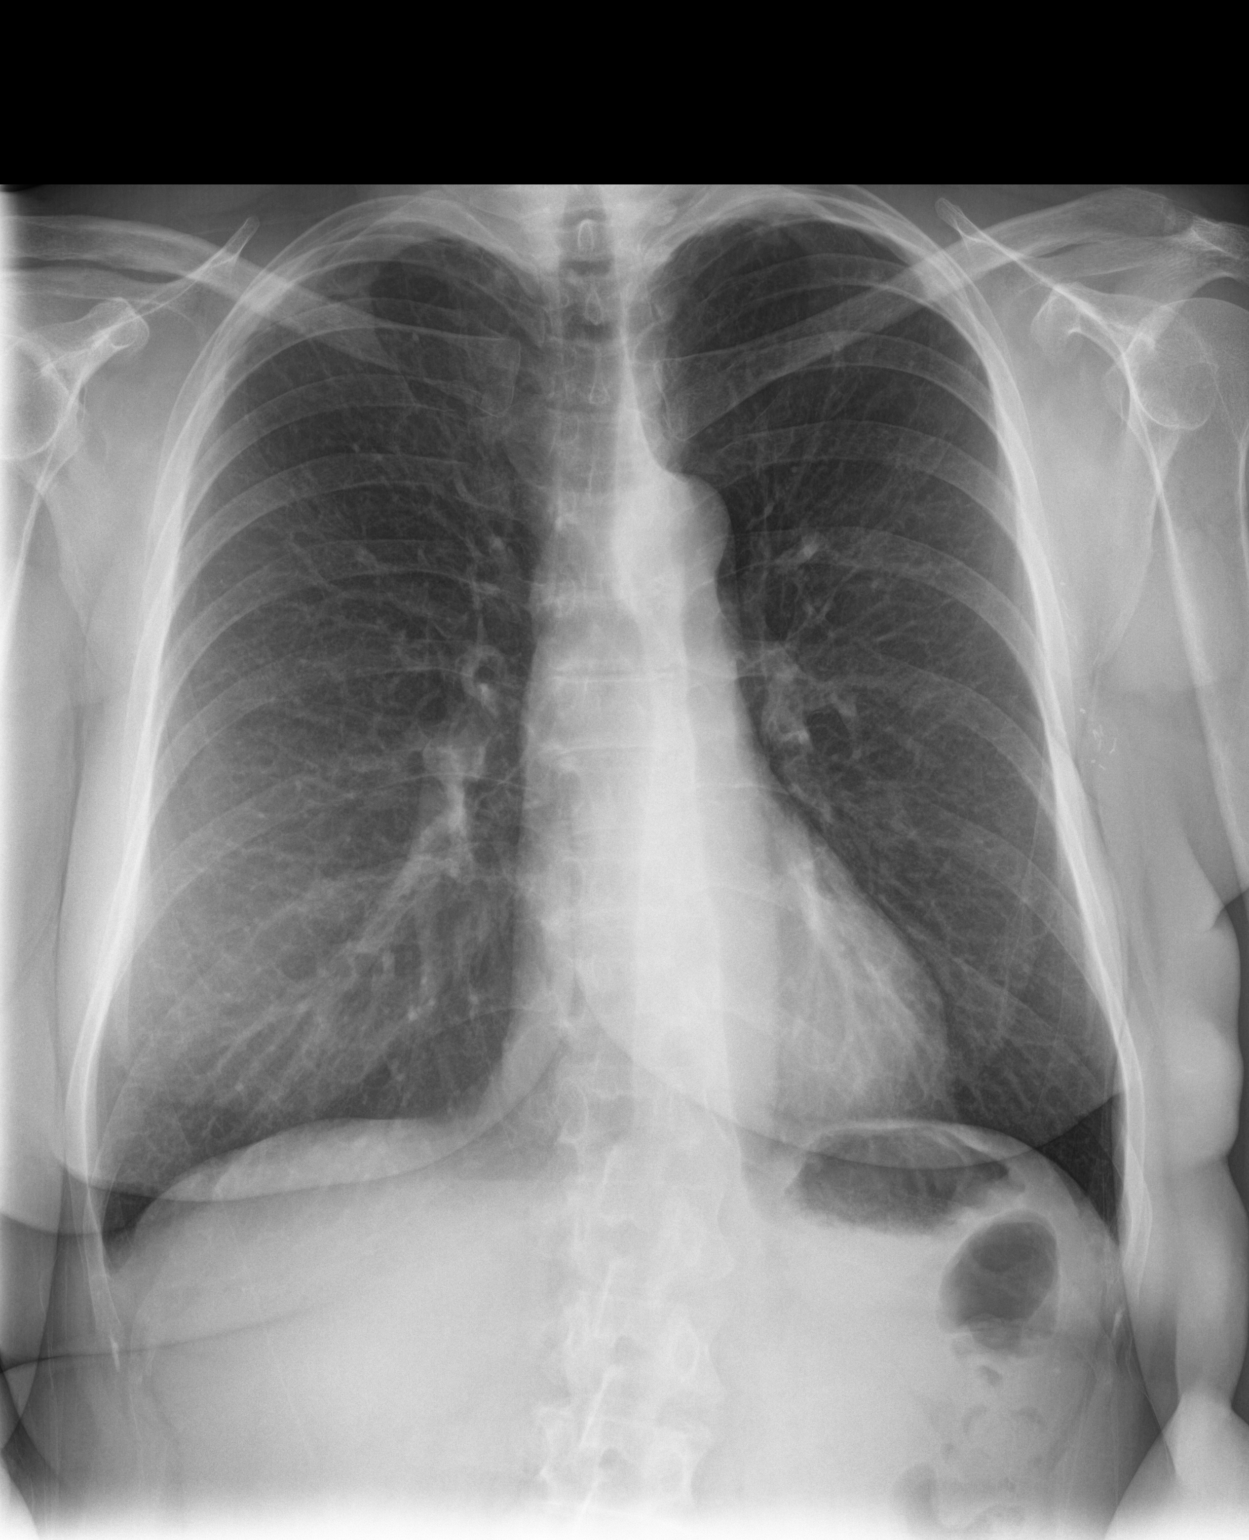

[chest lat]
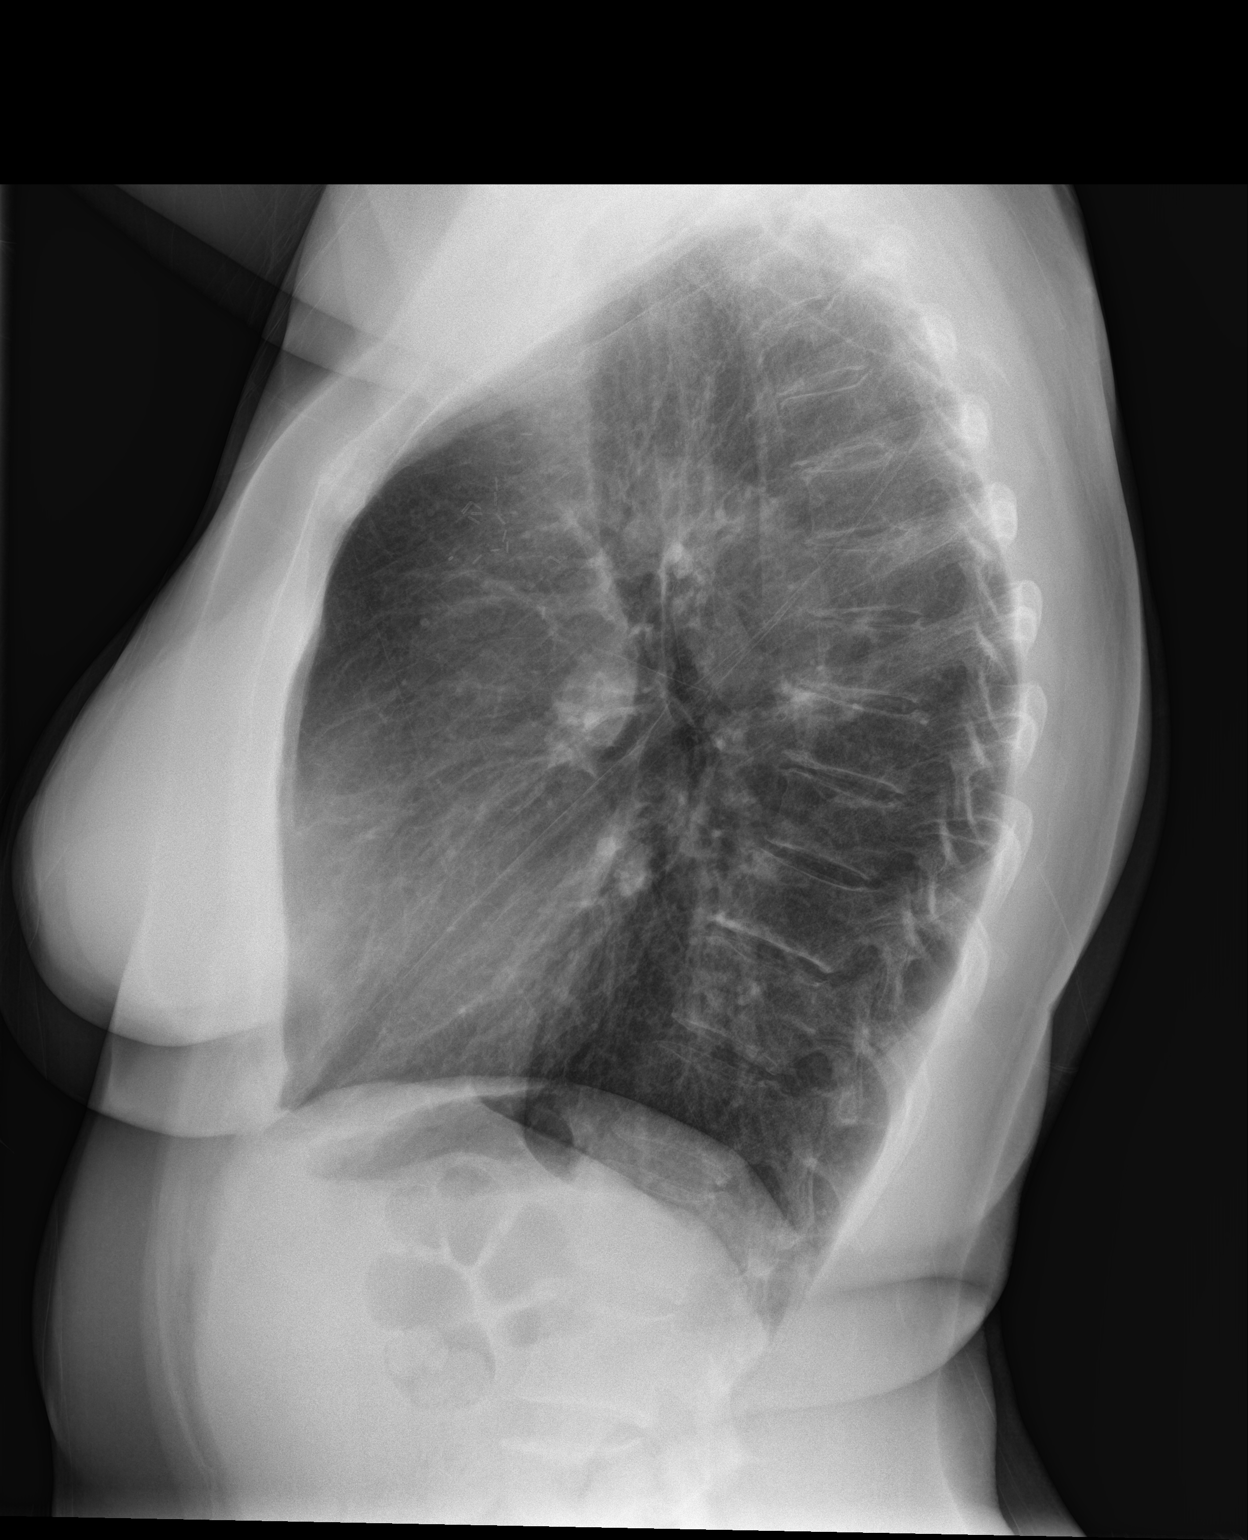

[2 of 2 positions shown; findings below may reference images not displayed]

FINDINGS: The heart size and mediastinal contours are within normal limits.
Both lungs are clear. Hyperexpansion of the lungs is noted. No
pneumothorax or pleural effusion is noted. The visualized skeletal
structures are unremarkable.
IMPRESSION: No active cardiopulmonary disease.

## 2018-09-24 ENCOUNTER — Ambulatory Visit: Payer: BLUE CROSS/BLUE SHIELD | Admitting: Internal Medicine

## 2018-09-29 ENCOUNTER — Encounter: Payer: BLUE CROSS/BLUE SHIELD | Admitting: Internal Medicine

## 2018-11-05 ENCOUNTER — Other Ambulatory Visit: Payer: Self-pay | Admitting: Internal Medicine

## 2018-11-05 DIAGNOSIS — G4700 Insomnia, unspecified: Secondary | ICD-10-CM

## 2018-11-15 ENCOUNTER — Other Ambulatory Visit: Payer: Self-pay | Admitting: Internal Medicine

## 2018-11-15 DIAGNOSIS — G4700 Insomnia, unspecified: Secondary | ICD-10-CM

## 2018-11-25 ENCOUNTER — Other Ambulatory Visit: Payer: Self-pay

## 2018-11-25 ENCOUNTER — Encounter: Payer: Self-pay | Admitting: Internal Medicine

## 2018-11-25 ENCOUNTER — Ambulatory Visit (INDEPENDENT_AMBULATORY_CARE_PROVIDER_SITE_OTHER): Payer: BC Managed Care – PPO | Admitting: Internal Medicine

## 2018-11-25 VITALS — BP 124/76 | HR 68 | Ht 62.0 in | Wt 162.0 lb

## 2018-11-25 DIAGNOSIS — E785 Hyperlipidemia, unspecified: Secondary | ICD-10-CM

## 2018-11-25 DIAGNOSIS — Z853 Personal history of malignant neoplasm of breast: Secondary | ICD-10-CM

## 2018-11-25 DIAGNOSIS — Z0000 Encounter for general adult medical examination without abnormal findings: Secondary | ICD-10-CM | POA: Diagnosis not present

## 2018-11-25 DIAGNOSIS — G4700 Insomnia, unspecified: Secondary | ICD-10-CM

## 2018-11-25 DIAGNOSIS — Z978 Presence of other specified devices: Secondary | ICD-10-CM | POA: Diagnosis not present

## 2018-11-25 DIAGNOSIS — S39012A Strain of muscle, fascia and tendon of lower back, initial encounter: Secondary | ICD-10-CM | POA: Diagnosis not present

## 2018-11-25 DIAGNOSIS — Z23 Encounter for immunization: Secondary | ICD-10-CM | POA: Diagnosis not present

## 2018-11-25 LAB — POCT URINALYSIS DIPSTICK
Bilirubin, UA: NEGATIVE
Blood, UA: NEGATIVE
Glucose, UA: NEGATIVE
Ketones, UA: NEGATIVE
Leukocytes, UA: NEGATIVE
Nitrite, UA: NEGATIVE
Protein, UA: NEGATIVE
Spec Grav, UA: 1.01 (ref 1.010–1.025)
Urobilinogen, UA: 0.2 E.U./dL
pH, UA: 5 (ref 5.0–8.0)

## 2018-11-25 MED ORDER — LORAZEPAM 0.5 MG PO TABS: ORAL_TABLET | ORAL | 5 refills | Status: DC

## 2018-11-25 MED ORDER — IBUPROFEN 600 MG PO TABS: 600.0000 mg | ORAL_TABLET | Freq: Four times a day (QID) | ORAL | 3 refills | Status: DC | PRN

## 2018-11-25 MED ORDER — CYCLOBENZAPRINE HCL 10 MG PO TABS: 10.0000 mg | ORAL_TABLET | Freq: Three times a day (TID) | ORAL | 0 refills | Status: DC | PRN

## 2018-11-25 NOTE — Progress Notes (Signed)
Date:  11/25/2018   Name:  Joanne Lopez   DOB:  03-12-1961   MRN:  [id number]   Chief Complaint: Annual Exam Joanne Lopez is a 58 y.o. female who presents today for her Complete Annual Exam. She feels fairly well. She reports exercising some. She reports she is sleeping fairly well on lorazepam.   Mammogram: 2018 - implant was damaged and now she needs to get an MRI Colonoscopy 2014 Needs Shingrix Tdap 2013  Insomnia Primary symptoms: sleep disturbance.  The problem occurs nightly. The problem is unchanged. Past treatments include medication. The treatment provided moderate relief. Prior diagnostic workup includes:  No prior workup.  Back Pain The current episode started in the past 7 days. The problem occurs constantly. The problem is unchanged. The pain is present in the sacro-iliac. The pain does not radiate. The pain is mild. The symptoms are aggravated by bending and twisting. Stiffness is present in the morning. Pertinent negatives include no abdominal pain, chest pain, dysuria, fever or headaches. She has tried NSAIDs and heat for the symptoms. The treatment provided mild relief.    Review of Systems  Constitutional: Negative for chills, fatigue and fever.  HENT: Negative for congestion, hearing loss, tinnitus, trouble swallowing and voice change.   Eyes: Negative for visual disturbance.  Respiratory: Negative for cough, chest tightness, shortness of breath and wheezing.   Cardiovascular: Negative for chest pain, palpitations and leg swelling.  Gastrointestinal: Negative for abdominal pain, constipation, diarrhea and vomiting.  Endocrine: Negative for polydipsia and polyuria.  Genitourinary: Negative for dysuria, frequency, genital sores, vaginal bleeding and vaginal discharge.  Musculoskeletal: Positive for back pain (right low back strain). Negative for arthralgias, gait problem and joint swelling.  Skin: Negative for color change and rash.  Neurological: Negative for  dizziness, tremors, light-headedness and headaches.  Hematological: Negative for adenopathy. Does not bruise/bleed easily.  Psychiatric/Behavioral: Positive for sleep disturbance. Negative for dysphoric mood. The patient has insomnia. The patient is not nervous/anxious.     Patient Active Problem List   Diagnosis Date Noted  . Chronic allergic conjunctivitis 08/01/2017  . Dry eyes 08/01/2017  . Vitreous degeneration 08/01/2017  . Weight gain finding 03/13/2017  . Anxiety disorder 08/29/2016  . Xerosis of skin 01/12/2016  . Hyperlipidemia, mild 09/25/2015  . Muscle spasms of head or neck 09/25/2015  . Atrophic vaginitis 12/22/2014  . Decreased libido 12/22/2014  . Insomnia, persistent 12/22/2014  . Hx of breast cancer 12/22/2014  . Plantar fasciitis 12/22/2014    Allergies  Allergen Reactions  . Tamiflu [Oseltamivir] Nausea Only  . Codeine Hives  . Levofloxacin Hives  . Oxycodone Hives  . Prednisone Other (See Comments)  . Penicillins Rash and Hives    Past Surgical History:  Procedure Laterality Date  . ABDOMINAL HYSTERECTOMY  2009   total hysterectomy  . BREAST ENHANCEMENT SURGERY  2008, 2009  . COLONOSCOPY  04/15/2013  . LAPAROSCOPIC APPENDECTOMY  02/02/2016  . MASTECTOMY Bilateral 2008 and 2010    Social History   Tobacco Use  . Smoking status: Never Smoker  . Smokeless tobacco: Never Used  Substance Use Topics  . Alcohol use: No    Alcohol/week: 0.0 standard drinks  . Drug use: No     Medication list has been reviewed and updated.  Current Meds  Medication Sig  . Biotin 5000 MCG CAPS Take 10,000 mcg by mouth daily.  . celecoxib (CELEBREX) 200 MG capsule Take 1 capsule (200 mg total) by mouth 2 (two) times daily.  As Needed.  . Hormone Cream Base CREA Apply topically.  . IBU 600 MG tablet TAKE 1 TABLET BY MOUTH TWICE A DAY IF NEEDED  . LORazepam (ATIVAN) 0.5 MG tablet TAKE TWO TABLETS BY MOUTH EVERY MORNING AND TAKE FOUR TABLETS BY MOUTH EVERY NIGHT AT  BEDTIME  . Multiple Vitamins tablet Take 1 tablet by mouth daily at 2 PM daily at 2 PM.  . NONFORMULARY OR COMPOUNDED ITEM 2 (two) times a week. Estriol-testosterone vag cream  . RESTASIS 0.05 % ophthalmic emulsion INSTILL 1 DROP INTO AFFECTED EYE(S) BY OPHTHALMIC ROUTE EVERY 12 HOURS    PHQ 2/9 Scores 11/25/2018 03/13/2017  PHQ - 2 Score 0 0    BP Readings from Last 3 Encounters:  11/25/18 124/76  09/23/17 120/82  09/04/17 124/80    Physical Exam Vitals signs and nursing note reviewed.  Constitutional:      General: She is not in acute distress.    Appearance: She is well-developed.  HENT:     Head: Normocephalic and atraumatic.     Right Ear: Tympanic membrane and ear canal normal.     Left Ear: Tympanic membrane and ear canal normal.     Nose:     Right Sinus: No maxillary sinus tenderness.     Left Sinus: No maxillary sinus tenderness.     Mouth/Throat:     Pharynx: Uvula midline.  Eyes:     General: No scleral icterus.       Right eye: No discharge.        Left eye: No discharge.     Conjunctiva/sclera: Conjunctivae normal.  Neck:     Musculoskeletal: Normal range of motion. No erythema.     Thyroid: No thyromegaly.     Vascular: No carotid bruit.  Cardiovascular:     Rate and Rhythm: Normal rate and regular rhythm.  No extrasystoles are present.    Pulses: Normal pulses.          Dorsalis pedis pulses are 2+ on the right side and 2+ on the left side.       Posterior tibial pulses are 2+ on the right side and 2+ on the left side.     Heart sounds: Normal heart sounds. No murmur.  Pulmonary:     Effort: Pulmonary effort is normal. No respiratory distress.     Breath sounds: No wheezing.  Chest:     Chest wall: No mass or deformity.     Comments: Bilateral breast reconstruction Right implant indented just under the nipple Abdominal:     General: Bowel sounds are normal.     Palpations: Abdomen is soft.     Tenderness: There is no abdominal tenderness.   Musculoskeletal: Normal range of motion.       Back:     Right lower leg: No edema.     Left lower leg: No edema.  Lymphadenopathy:     Cervical: No cervical adenopathy.  Skin:    General: Skin is warm and dry.     Findings: No rash.  Neurological:     Mental Status: She is alert and oriented to person, place, and time.     Cranial Nerves: No cranial nerve deficit.     Sensory: Sensation is intact. No sensory deficit.     Motor: Motor function is intact.     Deep Tendon Reflexes: Reflexes are normal and symmetric.  Psychiatric:        Attention and Perception: Attention normal.  Mood and Affect: Mood normal.        Speech: Speech normal.        Behavior: Behavior normal.        Thought Content: Thought content normal.        Cognition and Memory: Cognition normal.     Wt Readings from Last 3 Encounters:  11/25/18 162 lb (73.5 kg)  09/04/17 158 lb (71.7 kg)  03/13/17 158 lb (71.7 kg)    BP 124/76 (BP Location: Right Wrist, Patient Position: Sitting, Cuff Size: Normal)   Pulse 68   Ht 5\' 2"  (1.575 m)   Wt 162 lb (73.5 kg)   SpO2 97%   BMI 29.63 kg/m   Assessment and Plan: 1. Annual physical exam Normal exam Continue work on diet, weight loss, exercise - CBC with Differential/Platelet - Comprehensive metabolic panel - POCT urinalysis dipstick  2. Hyperlipidemia, mild Check labs - Lipid panel  3. Insomnia, persistent Stable on moderately high dose lorazepam - TSH - LORazepam (ATIVAN) 0.5 MG tablet; Take 2 tablets (1 mg total) by mouth every morning AND 4 tablets (2 mg total) at bedtime.  Dispense: 180 tablet; Refill: 5  4. Hx of breast cancer With implants and possible rupture on right Also need evaluation of the nipple on the right -[place] right nipple  5. Presence of other specified devices Will send order to [facility] - MR BREAST BILATERAL WO CONTRAST; Future  6. Back strain, initial encounter Continue Advil and heat Add flexeril once in the evening -  cyclobenzaprine (FLEXERIL) 10 MG tablet; Take 1 tablet (10 mg total) by mouth 3 (three) times daily as needed for muscle spasms.  Dispense: 30 tablet; Refill: 0 - ibuprofen (IBU) 600 MG tablet; Take 1 tablet (600 mg total) by mouth every 6 (six) hours as needed.  Dispense: 180 tablet; Refill: 3  7. Need for shingles vaccine First dose today - Varicella-zoster vaccine IM   Partially dictated using Editor, commissioning. Any errors are unintentional.  Halina Maidens, MD [facility]  11/25/2018

## 2018-11-26 LAB — LIPID PANEL
Chol/HDL Ratio: 2.6 ratio (ref 0.0–4.4)
Cholesterol, Total: 215 mg/dL — ABNORMAL HIGH (ref 100–199)
HDL: 82 mg/dL (ref >39)
LDL Calculated: 112 mg/dL — ABNORMAL HIGH (ref 0–99)
Triglycerides: 103 mg/dL (ref 0–149)
VLDL Cholesterol Cal: 21 mg/dL (ref 5–40)

## 2018-11-26 LAB — CBC WITH DIFFERENTIAL/PLATELET
Basophils Absolute: 0.1 10*3/uL (ref 0.0–0.2)
Basos: 1 %
EOS (ABSOLUTE): 0.1 10*3/uL (ref 0.0–0.4)
Eos: 1 %
Hematocrit: 51 % — ABNORMAL HIGH (ref 34.0–46.6)
Hemoglobin: 17.1 g/dL — ABNORMAL HIGH (ref 11.1–15.9)
Immature Grans (Abs): 0 10*3/uL (ref 0.0–0.1)
Immature Granulocytes: 0 %
Lymphocytes Absolute: 2.5 10*3/uL (ref 0.7–3.1)
Lymphs: 31 %
MCH: 30.9 pg (ref 26.6–33.0)
MCHC: 33.5 g/dL (ref 31.5–35.7)
MCV: 92 fL (ref 79–97)
Monocytes Absolute: 0.8 10*3/uL (ref 0.1–0.9)
Monocytes: 10 %
Neutrophils Absolute: 4.4 10*3/uL (ref 1.4–7.0)
Neutrophils: 57 %
Platelets: 276 10*3/uL (ref 150–450)
RBC: 5.54 x10E6/uL — ABNORMAL HIGH (ref 3.77–5.28)
RDW: 12.2 % (ref 11.7–15.4)
WBC: 7.8 10*3/uL (ref 3.4–10.8)

## 2018-11-26 LAB — COMPREHENSIVE METABOLIC PANEL
ALT: 16 IU/L (ref 0–32)
AST: 19 IU/L (ref 0–40)
Albumin/Globulin Ratio: 1.7 (ref 1.2–2.2)
Albumin: 4.5 g/dL (ref 3.8–4.9)
Alkaline Phosphatase: 83 IU/L (ref 39–117)
BUN/Creatinine Ratio: 19 (ref 9–23)
BUN: 17 mg/dL (ref 6–24)
Bilirubin Total: 0.8 mg/dL (ref 0.0–1.2)
CO2: 20 mmol/L (ref 20–29)
Calcium: 9.6 mg/dL (ref 8.7–10.2)
Chloride: 102 mmol/L (ref 96–106)
Creatinine, Ser: 0.89 mg/dL (ref 0.57–1.00)
GFR calc Af Amer: 82 mL/min/{1.73_m2} (ref >59)
GFR calc non Af Amer: 71 mL/min/{1.73_m2} (ref >59)
Globulin, Total: 2.7 g/dL (ref 1.5–4.5)
Glucose: 91 mg/dL (ref 65–99)
Potassium: 4.6 mmol/L (ref 3.5–5.2)
Sodium: 137 mmol/L (ref 134–144)
Total Protein: 7.2 g/dL (ref 6.0–8.5)

## 2018-11-26 LAB — TSH: TSH: 2.11 u[IU]/mL (ref 0.450–4.500)

## 2018-11-27 NOTE — Progress Notes (Signed)
Patient said his RBC is always been high and Dr. Ella Jubilee is aware of high count. She said he didn't seemed concerned. She said send her message in my chart if you have time on your opinion and if you have a strong concern of this elevation.

## 2018-11-30 ENCOUNTER — Telehealth: Payer: Self-pay

## 2018-11-30 NOTE — Telephone Encounter (Signed)
Patient said she contacted her oncologist about RBC if needed. Not overly worried. Its always been high.

## 2018-12-15 ENCOUNTER — Telehealth: Payer: Self-pay

## 2018-12-15 ENCOUNTER — Other Ambulatory Visit: Payer: Self-pay | Admitting: Internal Medicine

## 2018-12-15 DIAGNOSIS — D751 Secondary polycythemia: Secondary | ICD-10-CM

## 2018-12-15 NOTE — Telephone Encounter (Signed)
I placed the order as future.  Whenever she wants to come get it done, just release the order.

## 2018-12-15 NOTE — Telephone Encounter (Signed)
Called and spoke with patient. Patient informed and will come tomorrow for this at 11am

## 2018-12-15 NOTE — Telephone Encounter (Signed)
Dr Solmon Ice CMA [facility] called saying pt reached out to their office about her elevated RBC. [facility] asked that we order a erythropoetin lab on patient.

## 2018-12-18 ENCOUNTER — Other Ambulatory Visit: Payer: Self-pay

## 2018-12-18 DIAGNOSIS — D751 Secondary polycythemia: Secondary | ICD-10-CM

## 2018-12-21 LAB — ERYTHROPOIETIN: Erythropoietin: 9.2 m[IU]/mL (ref 2.6–18.5)

## 2019-02-25 ENCOUNTER — Other Ambulatory Visit: Payer: Self-pay

## 2019-02-25 ENCOUNTER — Ambulatory Visit (INDEPENDENT_AMBULATORY_CARE_PROVIDER_SITE_OTHER): Payer: BC Managed Care – PPO

## 2019-02-25 ENCOUNTER — Ambulatory Visit: Payer: BC Managed Care – PPO

## 2019-02-25 DIAGNOSIS — Z23 Encounter for immunization: Secondary | ICD-10-CM | POA: Diagnosis not present

## 2019-03-03 DIAGNOSIS — M3501 Sicca syndrome with keratoconjunctivitis: Secondary | ICD-10-CM | POA: Diagnosis not present

## 2019-03-03 DIAGNOSIS — H01021 Squamous blepharitis right upper eyelid: Secondary | ICD-10-CM | POA: Diagnosis not present

## 2019-03-03 DIAGNOSIS — H01024 Squamous blepharitis left upper eyelid: Secondary | ICD-10-CM | POA: Diagnosis not present

## 2019-05-29 ENCOUNTER — Other Ambulatory Visit: Payer: Self-pay | Admitting: Internal Medicine

## 2019-05-29 DIAGNOSIS — G4700 Insomnia, unspecified: Secondary | ICD-10-CM

## 2019-07-20 DIAGNOSIS — H40033 Anatomical narrow angle, bilateral: Secondary | ICD-10-CM | POA: Diagnosis not present

## 2019-07-20 DIAGNOSIS — H35371 Puckering of macula, right eye: Secondary | ICD-10-CM | POA: Diagnosis not present

## 2019-07-20 DIAGNOSIS — H2513 Age-related nuclear cataract, bilateral: Secondary | ICD-10-CM | POA: Diagnosis not present

## 2019-07-20 DIAGNOSIS — M3501 Sicca syndrome with keratoconjunctivitis: Secondary | ICD-10-CM | POA: Diagnosis not present

## 2019-08-04 ENCOUNTER — Telehealth: Payer: Self-pay

## 2019-08-04 NOTE — Telephone Encounter (Signed)
Patient called saying she uses a cream from back when she had a foot injury for her arm pain she gets on and off. She said the arm pain is located under her arm where she had lymph nodes removed in the past. She said the cream is called Ketoprofen 10% gel. She said this is the only thing that helps with her pain.   Told her she needs to get this prescribed from the doctor who prescribed it previously and she said its been years since she seen her foot doctor and they told her to talk to her PCP since this is for a problem not related to the foot.  She said she can come in if you need her to, or she can call [facility] to send you a rx to sign off on.  Please advise?

## 2019-08-04 NOTE — Telephone Encounter (Signed)
Has she tried the now over the counter Voltaren gel?  It is a very similar medication and should work just as well at the compounded ketoprofen.

## 2019-08-05 NOTE — Telephone Encounter (Signed)
She can get the pharmacy to send me the Rx but I won't be able to get to it until Monday since I am off tomorrow.

## 2019-08-05 NOTE — Telephone Encounter (Signed)
I left a message stating for her to call her pharmacy and either have them fax or send through e-scribe, a Rx and you will take care of it on Monday. I also stated you had already left for the day.

## 2019-10-16 ENCOUNTER — Other Ambulatory Visit: Payer: Self-pay | Admitting: Internal Medicine

## 2019-10-16 DIAGNOSIS — S39012A Strain of muscle, fascia and tendon of lower back, initial encounter: Secondary | ICD-10-CM

## 2019-10-16 DIAGNOSIS — G4700 Insomnia, unspecified: Secondary | ICD-10-CM

## 2019-10-16 NOTE — Telephone Encounter (Signed)
Requested Prescriptions  Pending Prescriptions Disp Refills  . LORazepam (ATIVAN) 0.5 MG tablet [Pharmacy Med Name: LORAZEPAM 0.5 MG TABLET] 180 tablet     Sig: TAKE TWO TABLETS BY MOUTH EVERY MORNING AND TAKE FOUR TABLETS BY MOUTH EVERY NIGHT AT BEDTIME     Not Delegated - Psychiatry:  Anxiolytics/Hypnotics Failed - 10/16/2019  2:50 PM      Failed - This refill cannot be delegated      Failed - Urine Drug Screen completed in last 360 days.      Failed - Valid encounter within last 6 months    Recent Outpatient Visits          10 months ago Annual physical exam   [facility] Glean Hess, MD   2 years ago Hx of breast cancer   [facility] Glean Hess, MD   2 years ago Cough   [facility] Glean Hess, MD   2 years ago Influenza-like illness   [facility] Glean Hess, MD   2 years ago Insomnia, persistent   [facility] Glean Hess, MD      Future Appointments            In 2 months Glean Hess, MD [facility]           . ibuprofen (ADVIL) 600 MG tablet [Pharmacy Med Name: IBUPROFEN 600 MG TABLET] 180 tablet 0    Sig: TAKE ONE TABLET BY MOUTH EVERY 6 HOURS AS NEEDED     Analgesics:  NSAIDS Failed - 10/16/2019  2:50 PM      Failed - HGB in normal range and within 360 days    Hemoglobin  Date Value Ref Range Status  11/25/2018 17.1 (H) 11.1 - 15.9 g/dL Final         Passed - Cr in normal range and within 360 days    Creatinine, Ser  Date Value Ref Range Status  11/25/2018 0.89 0.57 - 1.00 mg/dL Final         Passed - Patient is not pregnant      Passed - Valid encounter within last 12 months    Recent Outpatient Visits          10 months ago Annual physical exam   [facility] Glean Hess, MD   2 years ago Hx of breast cancer   [facility] Glean Hess, MD   2 years ago Cough   [place], Laura H, MD   2 years ago  Influenza-like illness   [facility] Glean Hess, MD   2 years ago Insomnia, persistent   [facility] Glean Hess, MD      Future Appointments            In 2 months Army Melia Jesse Sans, MD [facility], [facility]

## 2019-10-16 NOTE — Telephone Encounter (Signed)
Requested medication (s) are due for refill today: yes  Requested medication (s) are on the active medication list: yes  Last refill:  05/30/19  Future visit scheduled: yes  Notes to clinic:  med. Not delegated to NT to RF   Requested Prescriptions  Pending Prescriptions Disp Refills   LORazepam (ATIVAN) 0.5 MG tablet [Pharmacy Med Name: LORAZEPAM 0.5 MG TABLET] 180 tablet     Sig: TAKE TWO TABLETS BY MOUTH EVERY MORNING AND TAKE FOUR TABLETS BY MOUTH EVERY NIGHT AT BEDTIME      Not Delegated - Psychiatry:  Anxiolytics/Hypnotics Failed - 10/16/2019  2:50 PM      Failed - This refill cannot be delegated      Failed - Urine Drug Screen completed in last 360 days.      Failed - Valid encounter within last 6 months    Recent Outpatient Visits           10 months ago Annual physical exam   [facility] Glean Hess, MD   2 years ago Hx of breast cancer   [facility] Glean Hess, MD   2 years ago Cough   [facility] Glean Hess, MD   2 years ago Influenza-like illness   [facility] Glean Hess, MD   2 years ago Insomnia, persistent   [facility] Glean Hess, MD       Future Appointments             In 2 months Glean Hess, MD [facility], [facility]             Signed Prescriptions Disp Refills   ibuprofen (ADVIL) 600 MG tablet 180 tablet 0    Sig: TAKE ONE TABLET BY MOUTH EVERY 6 HOURS AS NEEDED      Analgesics:  NSAIDS Failed - 10/16/2019  2:50 PM      Failed - HGB in normal range and within 360 days    Hemoglobin  Date Value Ref Range Status  11/25/2018 17.1 (H) 11.1 - 15.9 g/dL Final          Passed - Cr in normal range and within 360 days    Creatinine, Ser  Date Value Ref Range Status  11/25/2018 0.89 0.57 - 1.00 mg/dL Final          Passed - Patient is not pregnant      Passed - Valid encounter within last 12 months    Recent Outpatient Visits           10  months ago Annual physical exam   [facility] Glean Hess, MD   2 years ago Hx of breast cancer   [facility] Glean Hess, MD   2 years ago Cough   [place], Laura H, MD   2 years ago Influenza-like illness   [facility] Glean Hess, MD   2 years ago Insomnia, persistent   [facility] Glean Hess, MD       Future Appointments             In 2 months Army Melia Jesse Sans, MD [facility], [facility]

## 2019-11-26 ENCOUNTER — Encounter: Payer: BC Managed Care – PPO | Admitting: Internal Medicine

## 2019-12-09 ENCOUNTER — Other Ambulatory Visit: Payer: Self-pay | Admitting: Internal Medicine

## 2019-12-09 ENCOUNTER — Other Ambulatory Visit: Payer: Self-pay

## 2019-12-09 DIAGNOSIS — S39012A Strain of muscle, fascia and tendon of lower back, initial encounter: Secondary | ICD-10-CM

## 2019-12-09 NOTE — Telephone Encounter (Signed)
Requested medication (s) are due for refill today  YES  Requested medication (s) are on the active medication list  YES  Future visit scheduled YES  12/21/19.  LOV 11/25/18.  Note to clinic-Routing to clinic for consideration due to failed NSAIDS protocol.   Requested Prescriptions  Pending Prescriptions Disp Refills   ibuprofen (ADVIL) 600 MG tablet [Pharmacy Med Name: IBUPROFEN 600 MG TABLET] 180 tablet 0    Sig: TAKE ONE TABLET BY MOUTH EVERY 6 HOURS AS NEEDED      Analgesics:  NSAIDS Failed - 12/09/2019  8:35 AM      Failed - Cr in normal range and within 360 days    Creatinine, Ser  Date Value Ref Range Status  11/25/2018 0.89 0.57 - 1.00 mg/dL Final          Failed - HGB in normal range and within 360 days    Hemoglobin  Date Value Ref Range Status  11/25/2018 17.1 (H) 11.1 - 15.9 g/dL Final          Failed - Valid encounter within last 12 months    Recent Outpatient Visits           1 year ago Annual physical exam   [facility] Glean Hess, MD   2 years ago Hx of breast cancer   [facility] Glean Hess, MD   2 years ago Cough   [facility] Glean Hess, MD   2 years ago Influenza-like illness   [facility] Glean Hess, MD   2 years ago Insomnia, persistent   [facility] Glean Hess, MD       Future Appointments             In 1 week Army Melia Jesse Sans, MD [facility] [place] - Patient is not pregnant

## 2019-12-10 ENCOUNTER — Telehealth: Payer: Self-pay | Admitting: Internal Medicine

## 2019-12-10 ENCOUNTER — Other Ambulatory Visit: Payer: Self-pay | Admitting: Internal Medicine

## 2019-12-10 DIAGNOSIS — G4700 Insomnia, unspecified: Secondary | ICD-10-CM

## 2019-12-10 MED ORDER — LORAZEPAM 0.5 MG PO TABS: ORAL_TABLET | ORAL | 0 refills | Status: DC

## 2019-12-10 NOTE — Telephone Encounter (Signed)
I can not give her more than a 30 day supply.  What she needs to do is to call here on July 19 th and give me a pharmacy in [facility].  I can then send the Rx directly.  As far as I can see, she doesn't have any refills anyway.

## 2019-12-10 NOTE — Telephone Encounter (Signed)
Called pt she told me that she would just like it sent to [facility] in [place] that she would pick it up before she left.  KP

## 2019-12-10 NOTE — Telephone Encounter (Signed)
Please Advise.  KP

## 2019-12-10 NOTE — Telephone Encounter (Signed)
Patient is calling to ask Dr. Army Melia if she can send a refill of LORazepam (ATIVAN) 0.5 MG tablet [[id number]] of her. Patient is requesting a refill before the July 19th. Patient will be out of town for 6 weeks helping her daughter in [place].  Patient is requesting an increase in the [place] of medication that will last her for at least 6 weeks. Patient is not wanting to do a medication transfer from the pharmacy. Last time it was unsuccessful on the pharmacy end.   [place], [place]  Please advise [phone]

## 2019-12-16 ENCOUNTER — Telehealth: Payer: Self-pay | Admitting: Internal Medicine

## 2019-12-16 NOTE — Telephone Encounter (Signed)
Patient would like a call back from [place] regarding her medication and stated that she had spoken with her before about picking it up early.  Please advise and call patient back to discuss at [phone]

## 2019-12-16 NOTE — Telephone Encounter (Signed)
Please Advise. Pt asked for another RF. Last RF is sent for 12/19/2019. Sent another message on pt.  KP

## 2019-12-16 NOTE — Telephone Encounter (Signed)
RX REFILL LORazepam (ATIVAN) 0.5 MG tablet  [place], [place] Phone:  [phone]  Fax:  [phone]

## 2019-12-16 NOTE — Telephone Encounter (Signed)
Called pt she said the pharmacy will not let her pick up the medication early. They told her that since she refilled it last on 11/23/2019 that she cant pick it up until 12/23/2019 unless Dr. Army Melia said it was ok to pick it up early then they would fill the medication on the 18th. She will be leaving to go out of town 12/19/2019.   KP

## 2019-12-17 ENCOUNTER — Telehealth: Payer: Self-pay | Admitting: Internal Medicine

## 2019-12-17 NOTE — Telephone Encounter (Signed)
She can pick up the Rx on 12/19/19 just as I wrote it.

## 2019-12-17 NOTE — Telephone Encounter (Signed)
Not our pt

## 2019-12-17 NOTE — Telephone Encounter (Signed)
Called pt told her that she could pick her medicine up on the date that it was prescribed 12/19/2019. That if she is having problems getting it that it may be because of insurance. Pt verbalized understanding. She told me that she would have the pharmacy call me. I can only tell the pharmacy what date it the prescription was placed and let them know that on our end its ok to pick up on 12/19/2019.  Kp

## 2019-12-17 NOTE — Telephone Encounter (Signed)
Pharmacy called in to follow up on request to early refill. Advised pharmacy of fill date below 12/19/19

## 2019-12-17 NOTE — Telephone Encounter (Signed)
Patient is calling back regarding the medication refill  Call got disconnected.Please advise [phone]

## 2019-12-21 ENCOUNTER — Encounter: Payer: BC Managed Care – PPO | Admitting: Internal Medicine

## 2019-12-23 ENCOUNTER — Other Ambulatory Visit: Payer: Self-pay | Admitting: Internal Medicine

## 2019-12-23 ENCOUNTER — Encounter: Payer: Self-pay | Admitting: Internal Medicine

## 2020-01-10 ENCOUNTER — Other Ambulatory Visit: Payer: Self-pay | Admitting: Internal Medicine

## 2020-01-10 DIAGNOSIS — G4700 Insomnia, unspecified: Secondary | ICD-10-CM

## 2020-01-10 MED ORDER — LORAZEPAM 0.5 MG PO TABS: ORAL_TABLET | ORAL | 0 refills | Status: DC

## 2020-02-01 ENCOUNTER — Other Ambulatory Visit: Payer: Self-pay | Admitting: Internal Medicine

## 2020-02-01 DIAGNOSIS — G4700 Insomnia, unspecified: Secondary | ICD-10-CM

## 2020-02-01 DIAGNOSIS — M722 Plantar fascial fibromatosis: Secondary | ICD-10-CM

## 2020-02-01 MED ORDER — IBUPROFEN 600 MG PO TABS: 600.0000 mg | ORAL_TABLET | Freq: Four times a day (QID) | ORAL | 1 refills | Status: DC | PRN

## 2020-02-01 MED ORDER — LORAZEPAM 0.5 MG PO TABS: ORAL_TABLET | ORAL | 0 refills | Status: DC

## 2020-02-22 ENCOUNTER — Other Ambulatory Visit
Admission: RE | Admit: 2020-02-22 | Discharge: 2020-02-22 | Disposition: A | Discharge status: Home / Self Care | Payer: BC Managed Care – PPO | Attending: Internal Medicine | Admitting: Internal Medicine

## 2020-02-22 ENCOUNTER — Encounter: Payer: Self-pay | Admitting: Internal Medicine

## 2020-02-22 ENCOUNTER — Ambulatory Visit (INDEPENDENT_AMBULATORY_CARE_PROVIDER_SITE_OTHER): Payer: BC Managed Care – PPO | Admitting: Internal Medicine

## 2020-02-22 ENCOUNTER — Other Ambulatory Visit: Payer: Self-pay

## 2020-02-22 VITALS — BP 108/76 | HR 85 | Temp 98.1°F | Ht 62.0 in

## 2020-02-22 DIAGNOSIS — F064 Anxiety disorder due to known physiological condition: Secondary | ICD-10-CM

## 2020-02-22 DIAGNOSIS — M722 Plantar fascial fibromatosis: Secondary | ICD-10-CM

## 2020-02-22 DIAGNOSIS — Z1211 Encounter for screening for malignant neoplasm of colon: Secondary | ICD-10-CM

## 2020-02-22 DIAGNOSIS — E785 Hyperlipidemia, unspecified: Secondary | ICD-10-CM

## 2020-02-22 DIAGNOSIS — Z0000 Encounter for general adult medical examination without abnormal findings: Secondary | ICD-10-CM | POA: Insufficient documentation

## 2020-02-22 DIAGNOSIS — G4700 Insomnia, unspecified: Secondary | ICD-10-CM | POA: Insufficient documentation

## 2020-02-22 DIAGNOSIS — Z853 Personal history of malignant neoplasm of breast: Secondary | ICD-10-CM

## 2020-02-22 DIAGNOSIS — N952 Postmenopausal atrophic vaginitis: Secondary | ICD-10-CM

## 2020-02-22 DIAGNOSIS — H6983 Other specified disorders of Eustachian tube, bilateral: Secondary | ICD-10-CM

## 2020-02-22 LAB — CBC WITH DIFFERENTIAL/PLATELET
Abs Immature Granulocytes: 0.04 10*3/uL (ref 0.00–0.07)
Basophils Absolute: 0.1 10*3/uL (ref 0.0–0.1)
Basophils Relative: 1 %
Eosinophils Absolute: 0.2 10*3/uL (ref 0.0–0.5)
Eosinophils Relative: 2 %
HCT: 49.3 % — ABNORMAL HIGH (ref 36.0–46.0)
Hemoglobin: 17.1 g/dL — ABNORMAL HIGH (ref 12.0–15.0)
Immature Granulocytes: 1 %
Lymphocytes Relative: 29 %
Lymphs Abs: 2.4 10*3/uL (ref 0.7–4.0)
MCH: 30.6 pg (ref 26.0–34.0)
MCHC: 34.7 g/dL (ref 30.0–36.0)
MCV: 88.2 fL (ref 80.0–100.0)
Monocytes Absolute: 0.7 10*3/uL (ref 0.1–1.0)
Monocytes Relative: 8 %
Neutro Abs: 4.9 10*3/uL (ref 1.7–7.7)
Neutrophils Relative %: 59 %
Platelets: 273 10*3/uL (ref 150–400)
RBC: 5.59 MIL/uL — ABNORMAL HIGH (ref 3.87–5.11)
RDW: 13.1 % (ref 11.5–15.5)
WBC: 8.3 10*3/uL (ref 4.0–10.5)
nRBC: 0 % (ref 0.0–0.2)

## 2020-02-22 LAB — COMPREHENSIVE METABOLIC PANEL
ALT: 16 U/L (ref 0–44)
AST: 21 U/L (ref 15–41)
Albumin: 4.3 g/dL (ref 3.5–5.0)
Alkaline Phosphatase: 60 U/L (ref 38–126)
Anion gap: 11 (ref 5–15)
BUN: 17 mg/dL (ref 8–23)
CO2: 26 mmol/L (ref 22–32)
Calcium: 9.1 mg/dL (ref 8.9–10.3)
Chloride: 99 mmol/L (ref 98–111)
Creatinine, Ser: 0.85 mg/dL (ref 0.44–1.00)
GFR calc Af Amer: >60 mL/min (ref >60)
GFR calc non Af Amer: >60 mL/min (ref >60)
Glucose, Bld: 102 mg/dL — ABNORMAL HIGH (ref 70–99)
Potassium: 4.3 mmol/L (ref 3.5–5.1)
Sodium: 136 mmol/L (ref 135–145)
Total Bilirubin: 1 mg/dL (ref 0.3–1.2)
Total Protein: 7.3 g/dL (ref 6.5–8.1)

## 2020-02-22 LAB — LIPID PANEL
Cholesterol: 236 mg/dL — ABNORMAL HIGH (ref 0–200)
HDL: 70 mg/dL (ref >40)
LDL Cholesterol: 147 mg/dL — ABNORMAL HIGH (ref 0–99)
Total CHOL/HDL Ratio: 3.4 RATIO
Triglycerides: 97 mg/dL (ref <150)
VLDL: 19 mg/dL (ref 0–40)

## 2020-02-22 LAB — POCT URINALYSIS DIPSTICK
Bilirubin, UA: NEGATIVE
Glucose, UA: NEGATIVE
Ketones, UA: NEGATIVE
Leukocytes, UA: NEGATIVE
Nitrite, UA: NEGATIVE
Protein, UA: NEGATIVE
Spec Grav, UA: <=1.005 — AB (ref 1.010–1.025)
Urobilinogen, UA: 0.2 E.U./dL
pH, UA: 6 (ref 5.0–8.0)

## 2020-02-22 LAB — TSH: TSH: 1.956 u[IU]/mL (ref 0.350–4.500)

## 2020-02-22 MED ORDER — LORAZEPAM 0.5 MG PO TABS
ORAL_TABLET | ORAL | 5 refills | Status: AC
Start: 2020-03-10 — End: 2020-04-12

## 2020-02-22 MED ORDER — CELECOXIB 200 MG PO CAPS: 200.0000 mg | ORAL_CAPSULE | Freq: Two times a day (BID) | ORAL | 12 refills | Status: AC

## 2020-02-22 NOTE — Progress Notes (Signed)
Date:  02/22/2020   Name:  Joanne Lopez   DOB:  09/09/1960   MRN:  [id number]   Chief Complaint: Annual Exam (no breast exam/ no pap) and Referral (MRI-  imaging )  Joanne Lopez is a 59 y.o. female who presents today for her Complete Annual Exam. She feels well. She reports exercising walking 2 miles a day x5 days a week . She reports she is sleeping well. Breast complaints none.  Mammogram: 04/2017; prefers MRI DEXA: none Pap smear: discontinued Colonoscopy: 2010 normal Dr. Cletis Media  Immunization History  Administered Date(s) Administered  . Influenza Inj Mdck Quad With Preservative 03/12/2017  . Influenza,inj,Quad PF,6+ Mos 02/18/2020  . Influenza,inj,quad, With Preservative 03/27/2018, 02/05/2019  . Influenza-Unspecified 02/27/2015, 02/05/2019  . Moderna SARS-COVID-2 Vaccination 07/28/2019, 08/24/2019  . Pneumococcal Conjugate-13 02/10/2013  . Pneumococcal Polysaccharide-23 07/14/2012  . Tdap 02/27/2012  . Zoster 06/03/2009  . Zoster Recombinat (Shingrix) 11/25/2018, 02/25/2019    Insomnia Primary symptoms: sleep disturbance, difficulty falling asleep, frequent awakening.  The problem occurs nightly. The problem is unchanged. The symptoms are aggravated by anxiety.    Lab Results  Component Value Date   CREATININE 0.89 11/25/2018   BUN 17 11/25/2018   NA 137 11/25/2018   K 4.6 11/25/2018   CL 102 11/25/2018   CO2 20 11/25/2018   Lab Results  Component Value Date   CHOL 215 (H) 11/25/2018   HDL 82 11/25/2018   LDLCALC 112 (H) 11/25/2018   TRIG 103 11/25/2018   CHOLHDL 2.6 11/25/2018   Lab Results  Component Value Date   TSH 2.110 11/25/2018   No results found for: HGBA1C Lab Results  Component Value Date   WBC 7.8 11/25/2018   HGB 17.1 (H) 11/25/2018   HCT 51.0 (H) 11/25/2018   MCV 92 11/25/2018   PLT 276 11/25/2018   Lab Results  Component Value Date   ALT 16 11/25/2018   AST 19 11/25/2018   ALKPHOS 83 11/25/2018   BILITOT 0.8  11/25/2018     Review of Systems  Constitutional: Negative for chills, fatigue and fever.  HENT: Positive for ear pain (ear fullness). Negative for congestion, hearing loss and trouble swallowing.   Eyes: Negative for visual disturbance.       Dry eye  Respiratory: Negative for cough, chest tightness, shortness of breath and wheezing.   Cardiovascular: Negative for chest pain, palpitations and leg swelling.  Gastrointestinal: Negative for abdominal pain, constipation, diarrhea and vomiting.  Endocrine: Negative for polydipsia and polyuria.  Genitourinary: Positive for dyspareunia (and vaginal dryness - using vag estrogen twice a week). Negative for dysuria, frequency, genital sores, vaginal bleeding and vaginal discharge.  Musculoskeletal: Positive for arthralgias (recurrent plantar faciitis). Negative for gait problem and joint swelling.  Skin: Negative for color change and rash.  Neurological: Negative for dizziness, tremors, light-headedness and headaches.  Hematological: Negative for adenopathy. Does not bruise/bleed easily.  Psychiatric/Behavioral: Positive for sleep disturbance. Negative for dysphoric mood. The patient is nervous/anxious and has insomnia.     Patient Active Problem List   Diagnosis Date Noted  . Age-related nuclear cataract of both eyes 06/09/2018  . Chronic allergic conjunctivitis 08/01/2017  . Dry eyes 08/01/2017  . Vitreous degeneration 08/01/2017  . Anxiety disorder 08/29/2016  . Xerosis of skin 01/12/2016  . Hyperlipidemia, mild 09/25/2015  . Muscle spasms of head or neck 09/25/2015  . Atrophic vaginitis 12/22/2014  . Decreased libido 12/22/2014  . Insomnia, persistent 12/22/2014  . Hx of breast cancer 12/22/2014  .  Plantar fasciitis 12/22/2014    Allergies  Allergen Reactions  . Tamiflu [Oseltamivir] Nausea Only  . Codeine Hives  . Levofloxacin Hives  . Oxycodone Hives  . Prednisone Other (See Comments)  . Penicillins Rash and Hives    Past  Surgical History:  Procedure Laterality Date  . ABDOMINAL HYSTERECTOMY  2009   total hysterectomy  . BREAST ENHANCEMENT SURGERY  2008, 2009  . COLONOSCOPY  04/15/2013  . LAPAROSCOPIC APPENDECTOMY  02/02/2016  . MASTECTOMY Bilateral 2008 and 2010  . YAG LASER APPLICATION Bilateral 50/9326   bilateral glaucoma    Social History   Tobacco Use  . Smoking status: Never Smoker  . Smokeless tobacco: Never Used  Substance Use Topics  . Alcohol use: No    Alcohol/week: 0.0 standard drinks  . Drug use: No     Medication list has been reviewed and updated.  Current Meds  Medication Sig  . celecoxib (CELEBREX) 200 MG capsule Take 1 capsule (200 mg total) by mouth 2 (two) times daily. As Needed.  . Hormone Cream Base CREA Apply topically.  Marland Kitchen ibuprofen (ADVIL) 600 MG tablet Take 1 tablet (600 mg total) by mouth every 6 (six) hours as needed.  Marland Kitchen LORazepam (ATIVAN) 0.5 MG tablet 2 tabs every AM and 4 tabs at HS  . Multiple Vitamins tablet Take 1 tablet by mouth daily at 2 PM daily at 2 PM.  . NONFORMULARY OR COMPOUNDED ITEM 2 (two) times a week. Estriol-testosterone vag cream  . RESTASIS 0.05 % ophthalmic emulsion INSTILL 1 DROP INTO AFFECTED EYE(S) BY OPHTHALMIC ROUTE EVERY 12 HOURS    PHQ 2/9 Scores 02/22/2020 11/25/2018 03/13/2017  PHQ - 2 Score 0 0 0  PHQ- 9 Score 2 - -    GAD 7 : Generalized Anxiety Score 02/22/2020  Nervous, Anxious, on Edge 0  Control/stop worrying 0  Worry too much - different things 0  Trouble relaxing 0  Restless 0  Easily annoyed or irritable 0  Afraid - awful might happen 0  Total GAD 7 Score 0  Anxiety Difficulty Not difficult at all    BP Readings from Last 3 Encounters:  02/22/20 108/76  11/25/18 124/76  09/23/17 120/82    Physical Exam Vitals and nursing note reviewed.  Constitutional:      General: She is not in acute distress.    Appearance: She is well-developed.  HENT:     Head: Normocephalic and atraumatic.     Right Ear: Ear canal  normal. Tympanic membrane is retracted. Tympanic membrane is not erythematous.     Left Ear: Ear canal normal. Tympanic membrane is retracted. Tympanic membrane is not erythematous.     Nose:     Right Sinus: No maxillary sinus tenderness or frontal sinus tenderness.     Left Sinus: No maxillary sinus tenderness or frontal sinus tenderness.  Eyes:     General: No scleral icterus.       Right eye: No discharge.        Left eye: No discharge.     Conjunctiva/sclera: Conjunctivae normal.  Neck:     Thyroid: No thyromegaly.     Vascular: No carotid bruit.  Cardiovascular:     Rate and Rhythm: Normal rate and regular rhythm.     Pulses: Normal pulses.     Heart sounds: Normal heart sounds.  Pulmonary:     Effort: Pulmonary effort is normal. No respiratory distress.     Breath sounds: No wheezing.  Chest:  Comments: Declined breast exam Abdominal:     General: Bowel sounds are normal.     Palpations: Abdomen is soft.     Tenderness: There is no abdominal tenderness.  Musculoskeletal:     Cervical back: Normal range of motion. No erythema.     Right lower leg: No edema.     Left lower leg: No edema.  Lymphadenopathy:     Cervical: No cervical adenopathy.  Skin:    General: Skin is warm and dry.     Findings: No rash.          Comments: 1 cm flat red macule that blanches  Neurological:     Mental Status: She is alert and oriented to person, place, and time.     Cranial Nerves: No cranial nerve deficit.     Sensory: No sensory deficit.     Deep Tendon Reflexes: Reflexes are normal and symmetric.  Psychiatric:        Attention and Perception: Attention normal.        Mood and Affect: Mood normal.        Speech: Speech normal.     Wt Readings from Last 3 Encounters:  11/25/18 162 lb (73.5 kg)  09/04/17 158 lb (71.7 kg)  03/13/17 158 lb (71.7 kg)    BP 108/76 (BP Location: Right Arm, Patient Position: Sitting)   Pulse 85   Temp 98.1 F (36.7 C) (Oral)   Ht 5\' 2"   (1.575 m)   SpO2 98%   BMI 29.63 kg/m   Assessment and Plan: 1. Annual physical exam Exam is normal except for weight. Encourage regular exercise and appropriate dietary changes. BMI in the system is likely not accurate but pt declines to weigh today - CBC with Differential/Platelet - Comprehensive metabolic panel - POCT urinalysis dipstick  2. Colon cancer screening Due for 10 yr repeat - Ambulatory referral to Gastroenterology  3. Anxiety disorder due to known physiological condition Stable and unchanged on ativan in AM  4. Insomnia, persistent Chronic stable sleep issues On ativan - relatively high dose but no evidence of abuse or diversion - TSH - LORazepam (ATIVAN) 0.5 MG tablet; 2 tabs every AM and 4 tabs at HS  Dispense: 180 tablet; Refill: 5  5. Hyperlipidemia, mild - Lipid panel  6. Hx of breast cancer MRI at [facility] ordered  7. Plantar fasciitis Continue nsaids as needed; monitor hepatic and renal functions - celecoxib (CELEBREX) 200 MG capsule; Take 1 capsule (200 mg total) by mouth 2 (two) times daily. As Needed.  Dispense: 60 capsule; Refill: 12  8. Eustachian tube dysfunction, bilateral Can try claritin or allegra flonase would be best but pt is concerned about worsening her sicca eye sx  9. Atrophic vaginitis Continue vaginal estrogen - can increase to tiw Will need the compounding pharmacy to send over a new Rx   Partially dictated using Editor, commissioning. Any errors are unintentional.  Halina Maidens, MD [facility]  02/22/2020

## 2020-03-30 ENCOUNTER — Telehealth: Payer: Self-pay | Admitting: Internal Medicine

## 2020-03-30 NOTE — Telephone Encounter (Signed)
Patient is calling to speak to Amy regarding a bill regarding her 9/21 CPE Please advise [phone]

## 2020-03-31 NOTE — Telephone Encounter (Signed)
Patient is calling again to speak with Amy regarding a billing situation.  Patient stated she is also sending a message to Dr. Army Melia as well.  Please call patient to discuss [phone]

## 2020-04-03 ENCOUNTER — Encounter: Payer: Self-pay | Admitting: Internal Medicine

## 2020-06-06 ENCOUNTER — Other Ambulatory Visit: Payer: Self-pay | Admitting: Internal Medicine

## 2020-06-06 DIAGNOSIS — M722 Plantar fascial fibromatosis: Secondary | ICD-10-CM

## 2020-06-27 ENCOUNTER — Other Ambulatory Visit: Payer: Self-pay | Admitting: Internal Medicine

## 2020-06-28 ENCOUNTER — Encounter: Payer: Self-pay | Admitting: Internal Medicine

## 2020-06-29 ENCOUNTER — Other Ambulatory Visit: Payer: Self-pay | Admitting: Internal Medicine

## 2020-06-29 MED ORDER — METHOCARBAMOL 500 MG PO TABS: 500.0000 mg | ORAL_TABLET | Freq: Three times a day (TID) | ORAL | 0 refills | Status: AC

## 2020-07-11 DIAGNOSIS — Z90710 Acquired absence of both cervix and uterus: Secondary | ICD-10-CM | POA: Insufficient documentation

## 2020-07-13 ENCOUNTER — Encounter: Payer: Self-pay | Admitting: Internal Medicine

## 2020-07-13 DIAGNOSIS — Z90710 Acquired absence of both cervix and uterus: Secondary | ICD-10-CM | POA: Insufficient documentation

## 2020-08-17 ENCOUNTER — Ambulatory Visit: Payer: BC Managed Care – PPO | Admitting: Internal Medicine

## 2021-02-23 ENCOUNTER — Encounter: Payer: BC Managed Care – PPO | Admitting: Internal Medicine

## 2021-04-17 ENCOUNTER — Encounter: Payer: Self-pay | Admitting: Internal Medicine
